# Patient Record
Sex: Female | Born: 1945 | Race: Black or African American | Hispanic: No | Smoking: Former smoker
Health system: Southern US, Community
[De-identification: ages and names within clinical notes are randomized; demographics above are authoritative.]

## PROBLEM LIST (undated history)

## (undated) DIAGNOSIS — M130 Polyarthritis, unspecified: Secondary | ICD-10-CM

## (undated) DIAGNOSIS — J069 Acute upper respiratory infection, unspecified: Secondary | ICD-10-CM

## (undated) DIAGNOSIS — S92504A Nondisplaced unspecified fracture of right lesser toe(s), initial encounter for closed fracture: Secondary | ICD-10-CM

## (undated) DIAGNOSIS — H548 Legal blindness, as defined in USA: Secondary | ICD-10-CM

## (undated) DIAGNOSIS — E7800 Pure hypercholesterolemia, unspecified: Secondary | ICD-10-CM

## (undated) DIAGNOSIS — K7689 Other specified diseases of liver: Secondary | ICD-10-CM

## (undated) DIAGNOSIS — Z860100 Personal history of colon polyps, unspecified: Secondary | ICD-10-CM

## (undated) DIAGNOSIS — K219 Gastro-esophageal reflux disease without esophagitis: Secondary | ICD-10-CM

## (undated) DIAGNOSIS — E876 Hypokalemia: Secondary | ICD-10-CM

## (undated) DIAGNOSIS — M1990 Unspecified osteoarthritis, unspecified site: Secondary | ICD-10-CM

## (undated) DIAGNOSIS — R1310 Dysphagia, unspecified: Secondary | ICD-10-CM

## (undated) DIAGNOSIS — M65322 Trigger finger, left index finger: Secondary | ICD-10-CM

## (undated) DIAGNOSIS — M79676 Pain in unspecified toe(s): Secondary | ICD-10-CM

## (undated) DIAGNOSIS — H3552 Pigmentary retinal dystrophy: Secondary | ICD-10-CM

## (undated) DIAGNOSIS — R200 Anesthesia of skin: Secondary | ICD-10-CM

## (undated) DIAGNOSIS — M810 Age-related osteoporosis without current pathological fracture: Secondary | ICD-10-CM

## (undated) DIAGNOSIS — L739 Follicular disorder, unspecified: Secondary | ICD-10-CM

## (undated) DIAGNOSIS — M7989 Other specified soft tissue disorders: Secondary | ICD-10-CM

## (undated) DIAGNOSIS — M109 Gout, unspecified: Secondary | ICD-10-CM

## (undated) DIAGNOSIS — E559 Vitamin D deficiency, unspecified: Secondary | ICD-10-CM

## (undated) DIAGNOSIS — L309 Dermatitis, unspecified: Secondary | ICD-10-CM

## (undated) DIAGNOSIS — M8580 Other specified disorders of bone density and structure, unspecified site: Secondary | ICD-10-CM

## (undated) DIAGNOSIS — M545 Low back pain, unspecified: Secondary | ICD-10-CM

## (undated) DIAGNOSIS — Z9889 Other specified postprocedural states: Secondary | ICD-10-CM

## (undated) DIAGNOSIS — R768 Other specified abnormal immunological findings in serum: Secondary | ICD-10-CM

## (undated) DIAGNOSIS — G43909 Migraine, unspecified, not intractable, without status migrainosus: Secondary | ICD-10-CM

## (undated) DIAGNOSIS — E042 Nontoxic multinodular goiter: Secondary | ICD-10-CM

## (undated) DIAGNOSIS — I10 Essential (primary) hypertension: Secondary | ICD-10-CM

## (undated) DIAGNOSIS — E041 Nontoxic single thyroid nodule: Secondary | ICD-10-CM

## (undated) DIAGNOSIS — B977 Papillomavirus as the cause of diseases classified elsewhere: Secondary | ICD-10-CM

## (undated) DIAGNOSIS — S9031XA Contusion of right foot, initial encounter: Secondary | ICD-10-CM

## (undated) HISTORY — PX: CATARACT EXTRACTION: SUR2

## (undated) HISTORY — DX: Pigmentary retinal dystrophy: H35.52

## (undated) HISTORY — DX: Dysphagia, unspecified: R13.10

## (undated) HISTORY — DX: Gout, unspecified: M10.9

## (undated) HISTORY — DX: Legal blindness, as defined in USA: H54.8

## (undated) HISTORY — DX: Other specified disorders of bone density and structure, unspecified site: M85.80

## (undated) HISTORY — DX: Pure hypercholesterolemia, unspecified: E78.00

## (undated) HISTORY — DX: Acute upper respiratory infection, unspecified: J06.9

## (undated) HISTORY — DX: Papillomavirus as the cause of diseases classified elsewhere: B97.7

## (undated) HISTORY — DX: Hypokalemia: E87.6

## (undated) HISTORY — DX: Trigger finger, left index finger: M65.322

## (undated) HISTORY — DX: Other specified soft tissue disorders: M79.89

## (undated) HISTORY — DX: Nontoxic multinodular goiter: E04.2

## (undated) HISTORY — DX: Essential (primary) hypertension: I10

## (undated) HISTORY — DX: Other specified abnormal immunological findings in serum: R76.8

## (undated) HISTORY — DX: Pain in unspecified toe(s): M79.676

## (undated) HISTORY — DX: Gastro-esophageal reflux disease without esophagitis: K21.9

## (undated) HISTORY — DX: Migraine, unspecified, not intractable, without status migrainosus: G43.909

## (undated) HISTORY — DX: Contusion of right foot, initial encounter: S90.31XA

## (undated) HISTORY — DX: Unspecified osteoarthritis, unspecified site: M19.90

## (undated) HISTORY — DX: Other specified diseases of liver: K76.89

## (undated) HISTORY — DX: Dermatitis, unspecified: L30.9

## (undated) HISTORY — DX: Other specified postprocedural states: Z98.890

## (undated) HISTORY — DX: Polyarthritis, unspecified: M13.0

## (undated) HISTORY — PX: ABDOMINAL HYSTERECTOMY: SHX81

## (undated) HISTORY — DX: Personal history of colon polyps, unspecified: Z86.0100

## (undated) HISTORY — DX: Age-related osteoporosis without current pathological fracture: M81.0

## (undated) HISTORY — DX: Anesthesia of skin: R20.0

## (undated) HISTORY — DX: Low back pain, unspecified: M54.50

## (undated) HISTORY — DX: Vitamin D deficiency, unspecified: E55.9

## (undated) HISTORY — DX: Nontoxic single thyroid nodule: E04.1

## (undated) HISTORY — DX: Personal history of colonic polyps: Z86.010

## (undated) HISTORY — PX: LASER LAPAROSCOPY: SHX1952

## (undated) HISTORY — DX: Nondisplaced unspecified fracture of right lesser toe(s), initial encounter for closed fracture: S92.504A

## (undated) HISTORY — DX: Follicular disorder, unspecified: L73.9

---

## 2008-11-25 ENCOUNTER — Encounter: Admission: RE | Admit: 2008-11-25 | Discharge: 2008-11-25 | Payer: Self-pay | Admitting: Family Medicine

## 2010-11-16 ENCOUNTER — Other Ambulatory Visit: Payer: Self-pay | Admitting: Family Medicine

## 2010-11-16 DIAGNOSIS — Z780 Asymptomatic menopausal state: Secondary | ICD-10-CM

## 2010-11-16 DIAGNOSIS — Z1231 Encounter for screening mammogram for malignant neoplasm of breast: Secondary | ICD-10-CM

## 2010-11-25 ENCOUNTER — Other Ambulatory Visit: Payer: Self-pay

## 2010-11-25 ENCOUNTER — Ambulatory Visit: Payer: Self-pay

## 2010-12-21 ENCOUNTER — Ambulatory Visit
Admission: RE | Admit: 2010-12-21 | Discharge: 2010-12-21 | Disposition: A | Discharge status: Home / Self Care | Payer: Medicare Other | Source: Ambulatory Visit | Attending: Family Medicine | Admitting: Family Medicine

## 2010-12-21 ENCOUNTER — Ambulatory Visit
Admission: RE | Admit: 2010-12-21 | Discharge: 2010-12-21 | Disposition: A | Discharge status: Home / Self Care | Payer: Self-pay | Source: Ambulatory Visit | Attending: Family Medicine | Admitting: Family Medicine

## 2010-12-21 DIAGNOSIS — Z780 Asymptomatic menopausal state: Secondary | ICD-10-CM

## 2010-12-21 DIAGNOSIS — Z1231 Encounter for screening mammogram for malignant neoplasm of breast: Secondary | ICD-10-CM

## 2012-03-07 ENCOUNTER — Other Ambulatory Visit (HOSPITAL_COMMUNITY): Payer: Self-pay | Admitting: Family Medicine

## 2012-03-07 DIAGNOSIS — I6529 Occlusion and stenosis of unspecified carotid artery: Secondary | ICD-10-CM

## 2012-04-02 ENCOUNTER — Encounter (HOSPITAL_COMMUNITY): Payer: Medicare Other

## 2012-04-04 ENCOUNTER — Ambulatory Visit (HOSPITAL_COMMUNITY)
Admission: RE | Admit: 2012-04-04 | Discharge: 2012-04-04 | Disposition: A | Discharge status: Home / Self Care | Payer: Medicare Other | Source: Ambulatory Visit | Attending: Cardiovascular Disease | Admitting: Cardiovascular Disease

## 2012-04-04 DIAGNOSIS — I6529 Occlusion and stenosis of unspecified carotid artery: Secondary | ICD-10-CM | POA: Insufficient documentation

## 2012-04-04 NOTE — Progress Notes (Signed)
Carotid artery duplex completed.  04/04/2012  Joyce Leckey, RDMS, RDCS   

## 2012-05-06 ENCOUNTER — Other Ambulatory Visit: Payer: Self-pay | Admitting: Family Medicine

## 2012-05-06 DIAGNOSIS — Z1231 Encounter for screening mammogram for malignant neoplasm of breast: Secondary | ICD-10-CM

## 2012-05-29 ENCOUNTER — Ambulatory Visit
Admission: RE | Admit: 2012-05-29 | Discharge: 2012-05-29 | Disposition: A | Discharge status: Home / Self Care | Payer: Medicare Other | Source: Ambulatory Visit | Attending: Family Medicine | Admitting: Family Medicine

## 2012-05-29 DIAGNOSIS — Z1231 Encounter for screening mammogram for malignant neoplasm of breast: Secondary | ICD-10-CM

## 2012-07-29 ENCOUNTER — Other Ambulatory Visit: Payer: Self-pay | Admitting: Gastroenterology

## 2012-07-29 DIAGNOSIS — R1310 Dysphagia, unspecified: Secondary | ICD-10-CM

## 2012-07-29 HISTORY — DX: Dysphagia, unspecified: R13.10

## 2012-08-05 ENCOUNTER — Ambulatory Visit
Admission: RE | Admit: 2012-08-05 | Discharge: 2012-08-05 | Disposition: A | Discharge status: Home / Self Care | Payer: Medicare Other | Source: Ambulatory Visit | Attending: Gastroenterology | Admitting: Gastroenterology

## 2012-08-05 DIAGNOSIS — R1310 Dysphagia, unspecified: Secondary | ICD-10-CM

## 2013-05-21 ENCOUNTER — Encounter (INDEPENDENT_AMBULATORY_CARE_PROVIDER_SITE_OTHER): Payer: Self-pay | Admitting: General Surgery

## 2013-05-29 ENCOUNTER — Encounter (INDEPENDENT_AMBULATORY_CARE_PROVIDER_SITE_OTHER): Payer: Self-pay | Admitting: General Surgery

## 2013-05-29 ENCOUNTER — Ambulatory Visit (INDEPENDENT_AMBULATORY_CARE_PROVIDER_SITE_OTHER): Payer: Medicare Other | Admitting: General Surgery

## 2013-05-29 VITALS — BP 127/83 | HR 83 | Temp 98.7°F | Resp 18 | Ht 63.0 in | Wt 178.2 lb

## 2013-05-29 DIAGNOSIS — D1739 Benign lipomatous neoplasm of skin and subcutaneous tissue of other sites: Secondary | ICD-10-CM

## 2013-05-29 DIAGNOSIS — D1720 Benign lipomatous neoplasm of skin and subcutaneous tissue of unspecified limb: Secondary | ICD-10-CM | POA: Insufficient documentation

## 2013-05-29 NOTE — Patient Instructions (Signed)
Once we get clearance from your heart doctor (Dr Johnsie Cancel), our office will contact you to schedule surgery  This is most consistent with a lipoma

## 2013-05-29 NOTE — Progress Notes (Signed)
Patient ID: Tracy Bruce, female   DOB: [birth year], 68 y.o.   MRN: [id number]  Chief Complaint  Patient presents with  . Mass    back    HPI Tracy Bruce is a 68 y.o. female.   HPI 68 yo AAF referred by Dr Orland Penman for evaluation of left posterior shoulder soft tissue mass.  Patient states that the lesion has been present for at least 8 years. However it only recently started bothering her a few months ago. She states it is uncomfortable for her bra strap to lay across the lesion. It has never drained anything. She denies any trauma to the area. She denies any weight loss. She denies any other soft tissue masses. She denies any lymphadenopathy. She denies any fevers or chills.  Past Medical History  Diagnosis Date  . Migraine   . Legally blind   . GERD (gastroesophageal reflux disease)   . H/O colonoscopy   . Retinitis pigmentosa   . Arthritis   . Hypertension     Past Surgical History  Procedure Laterality Date  . Abdominal hysterectomy    . Cataract extraction    . Laser laparoscopy      History reviewed. No pertinent family history.  Social History History  Substance Use Topics  . Smoking status: Former Research scientist (life sciences)  . Smokeless tobacco: Not on file  . Alcohol Use: Yes     Comment: 8 oz    No Known Allergies  Current Outpatient Prescriptions  Medication Sig Dispense Refill  . Ascorbic Acid (VITAMIN C) 100 MG tablet Take 100 mg by mouth daily.      . calcium-vitamin D 250-100 MG-UNIT per tablet Take 1 tablet by mouth 2 (two) times daily.      Marland Kitchen ibuprofen (ADVIL,MOTRIN) 200 MG tablet Take 200 mg by mouth every 6 (six) hours as needed.      Marland Kitchen lisinopril-hydrochlorothiazide (PRINZIDE,ZESTORETIC) 20-25 MG per tablet Take 1 tablet by mouth daily.      . Omega-3 Fatty Acids (FISH OIL) 1000 MG CAPS Take by mouth.      Marland Kitchen omeprazole (PRILOSEC) 20 MG capsule Take 20 mg by mouth daily.      . SUMAtriptan Succinate (IMITREX PO) Take by mouth.      . vitamin A 10000 UNIT capsule  Take 10,000 Units by mouth daily.      . vitamin B-12 (CYANOCOBALAMIN) 1000 MCG tablet Take 1,000 mcg by mouth daily.       No current facility-administered medications for this visit.    Review of Systems Review of Systems  Constitutional: Negative for fever, activity change, appetite change and unexpected weight change.  HENT: Negative for nosebleeds and trouble swallowing.   Eyes: Negative for photophobia and visual disturbance.  Respiratory: Negative for chest tightness and shortness of breath.   Cardiovascular: Negative for chest pain (some chest pounding) and leg swelling.       Denies CP, SOB, orthopnea, PND, DOE; has upcoming appt with cardiology - Dr Johnsie Cancel  Genitourinary: Negative for dysuria and difficulty urinating.  Musculoskeletal: Negative for arthralgias.  Skin: Negative for pallor and rash.  Neurological: Negative for dizziness, seizures, facial asymmetry and numbness.       Denies TIA and amaurosis fugax   Hematological: Negative for adenopathy. Does not bruise/bleed easily.  Psychiatric/Behavioral: Negative for behavioral problems and agitation.    Blood pressure 127/83, pulse 83, temperature 98.7 F (37.1 C), temperature source Temporal, resp. rate 18, height 5\' 3"  (1.6 m), weight 178 lb 3.2 oz (  80.831 kg).  Physical Exam Physical Exam  Vitals reviewed. Constitutional: She is oriented to person, place, and time. She appears well-developed and well-nourished. No distress.  HENT:  Head: Normocephalic and atraumatic.  Right Ear: External ear normal.  Left Ear: External ear normal.  Eyes: Conjunctivae are normal. No scleral icterus.  Neck: Normal range of motion. Neck supple. No tracheal deviation present. No thyromegaly present.  Cardiovascular: Normal rate and normal heart sounds.   Pulmonary/Chest: Effort normal and breath sounds normal. No stridor. No respiratory distress. She has no wheezes.    Left shoulder soft tissue mass 2 x2 cm; well cirumscribed.  Mobile. No LAD. Very mild TTP. No skin changes  Abdominal: Soft. She exhibits no distension.  Musculoskeletal: She exhibits no edema and no tenderness.  Lymphadenopathy:    She has no cervical adenopathy.       Left axillary: No pectoral and no lateral adenopathy present.      Right: No supraclavicular adenopathy present.       Left: No supraclavicular adenopathy present.  Neurological: She is alert and oriented to person, place, and time. She exhibits normal muscle tone.  Skin: Skin is warm and dry. No rash noted. She is not diaphoretic. No erythema.  Psychiatric: She has a normal mood and affect. Her behavior is normal. Judgment and thought content normal.    Data Reviewed Dr [name] note  Assessment    Left shoulder subcutaneous mass     Plan    This is most consistent with a lipoma.  We discussed the etiology and management of lipomas. The patient was given educational material. We discussed that the majority of lipomas are benign although on a rare occasion it can be malignant.   We discussed observation versus surgical excision. We discussed the risks and benefits of surgery including but not limited to bleeding, infection, injury to surrounding structures, scarring, cosmetic concerns, blood clot formation, anesthesia issues, possible recurrence, and the typical postoperative course.   She has an appointment later this month with a cardiologist to evaluate chest pounding so we will need to wait until after that appointment to make sure she is clear from a cardiac standpoint before we allow her to schedule surgery. Our office will contact her to schedule surgery once she has been cleared by the cardiologist  Leighton Ruff. Redmond Pulling, MD, FACS General, Bariatric, & Minimally Invasive Surgery [facility], [place]          [facility] M 05/29/2013, 2:22 PM

## 2013-06-12 ENCOUNTER — Encounter: Payer: Self-pay | Admitting: *Deleted

## 2013-06-12 DIAGNOSIS — D1720 Benign lipomatous neoplasm of skin and subcutaneous tissue of unspecified limb: Secondary | ICD-10-CM

## 2013-06-13 ENCOUNTER — Encounter: Payer: Self-pay | Admitting: Cardiovascular Disease

## 2013-06-13 ENCOUNTER — Ambulatory Visit (INDEPENDENT_AMBULATORY_CARE_PROVIDER_SITE_OTHER): Payer: Medicare Other | Admitting: Cardiovascular Disease

## 2013-06-13 VITALS — BP 130/80 | HR 74 | Ht 63.0 in | Wt 177.8 lb

## 2013-06-13 DIAGNOSIS — R0989 Other specified symptoms and signs involving the circulatory and respiratory systems: Secondary | ICD-10-CM

## 2013-06-13 DIAGNOSIS — E785 Hyperlipidemia, unspecified: Secondary | ICD-10-CM

## 2013-06-13 DIAGNOSIS — R002 Palpitations: Secondary | ICD-10-CM

## 2013-06-13 DIAGNOSIS — R0600 Dyspnea, unspecified: Secondary | ICD-10-CM

## 2013-06-13 DIAGNOSIS — R0609 Other forms of dyspnea: Secondary | ICD-10-CM

## 2013-06-13 NOTE — Patient Instructions (Signed)
Your physician recommends that you schedule a follow-up appointment in: AS NEEDED Your physician recommends that you continue on your current medications as directed. Please refer to the Current Medication list given to you today. Your physician has recommended that you wear an event monitor. Event monitors are medical devices that record the heart's electrical activity. Doctors most often us these monitors to diagnose arrhythmias. Arrhythmias are problems with the speed or rhythm of the heartbeat. The monitor is a small, portable device. You can wear one while you do your normal daily activities. This is usually used to diagnose what is causing palpitations/syncope (passing out).  Your physician has requested that you have an echocardiogram. Echocardiography is a painless test that uses sound waves to create images of your heart. It provides your doctor with information about the size and shape of your heart and how well your heart's chambers and valves are working. This procedure takes approximately one hour. There are no restrictions for this procedure.   

## 2013-06-13 NOTE — Assessment & Plan Note (Signed)
No known vascular disease Diet Rx appropriate F/U primary

## 2013-06-13 NOTE — Progress Notes (Signed)
Patient ID: Tracy Bruce, female   DOB: [birth year], 68 y.o.   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  Been going on for a month.  Pounding in chest.  While I was in room she indicated having it and she appeared to mostly be in regular rhythm with occasional premature beat.  No history of DCM, valve disease syncope or CAD.  Reviewed [facility] records and lab work normal LDL 134 She gets palpitations daily.  If she sits and takes a few deep breaths feels better Denies any unusual stress.  No new stimulants , cold medicines or excess caffeine.  Her cousin was with her today She helps her get around and with transportation     ROS: Denies fever, malais, weight loss, blurry vision, decreased visual acuity, cough, sputum, SOB, hemoptysis, pleuritic pain, palpitaitons, heartburn, abdominal pain, melena, lower extremity edema, claudication, or rash.  All other systems reviewed and negative   General: Affect appropriate Healthy:  appears stated age 68: Blind from RP  Neck supple with no adenopathy JVP normal no bruits no thyromegaly Lungs clear with no wheezing and good diaphragmatic motion Heart:  S1/S2 no murmur,rub, gallop or click PMI normal Abdomen: benighn, BS positve, no tenderness, no AAA no bruit.  No HSM or HJR Distal pulses intact with no bruits No edema Neuro non-focal Skin warm and dry No muscular weakness  Medications Current Outpatient Prescriptions  Medication Sig Dispense Refill  . ibuprofen (ADVIL,MOTRIN) 200 MG tablet Take 200 mg by mouth every 6 (six) hours as needed.      Marland Kitchen lisinopril-hydrochlorothiazide (PRINZIDE,ZESTORETIC) 20-25 MG per tablet Take 1 tablet by mouth daily.      . NON FORMULARY OMEGA RED DAILY      . Omega-3 Fatty Acids (FISH OIL) 1000 MG CAPS Take by mouth.      . SUMAtriptan Succinate (IMITREX PO) Take by mouth.      . vitamin A 10000 UNIT capsule Take 10,000 Units by mouth daily.      . vitamin B-12  (CYANOCOBALAMIN) 1000 MCG tablet Take 1,000 mcg by mouth daily.       No current facility-administered medications for this visit.    Allergies Review of patient's allergies indicates no known allergies.  Family History: Family History  Problem Relation Age of Onset  . Lung cancer Father   . Heart disease Mother   . Lung cancer Brother   . Liver disease Brother   . Colon cancer Maternal Aunt   . Colon cancer Maternal Uncle   . Colon polyps Cousin   . Alcohol abuse Brother   . Alcohol abuse Brother   . Alcohol abuse Brother   . Alcohol abuse Brother   . Schizophrenia Brother   . Drug abuse Brother   . Alcohol abuse Brother     Social History: History   Social History  . Marital Status: Divorced    Spouse Name: N/A    Number of Children: 3  . Years of Education: N/A   Occupational History  . Not on file.   Social History Main Topics  . Smoking status: Former Research scientist (life sciences)  . Smokeless tobacco: Not on file     Comment: quit early 2000's  . Alcohol Use: 0.0 oz/week     Comment: glass of wine monthly   . Drug Use: No  . Sexual Activity: Not on file   Other Topics Concern  . Not on file   Social History  Narrative  . No narrative on file    Electrocardiogram:  SR rate 61 normal ECG   Assessment and Plan

## 2013-06-13 NOTE — Assessment & Plan Note (Signed)
Benign sounding Normal ECG and exam  Since she describes them as daily will get event monitor.  Echo to r/o structural heart disease

## 2013-06-25 ENCOUNTER — Ambulatory Visit (HOSPITAL_COMMUNITY): Payer: Medicare Other | Attending: Cardiovascular Disease | Admitting: Radiology

## 2013-06-25 ENCOUNTER — Encounter: Payer: Self-pay | Admitting: Cardiovascular Disease

## 2013-06-25 DIAGNOSIS — R0609 Other forms of dyspnea: Secondary | ICD-10-CM | POA: Insufficient documentation

## 2013-06-25 DIAGNOSIS — R072 Precordial pain: Secondary | ICD-10-CM

## 2013-06-25 DIAGNOSIS — R002 Palpitations: Secondary | ICD-10-CM | POA: Insufficient documentation

## 2013-06-25 DIAGNOSIS — R0600 Dyspnea, unspecified: Secondary | ICD-10-CM

## 2013-06-25 DIAGNOSIS — R0989 Other specified symptoms and signs involving the circulatory and respiratory systems: Secondary | ICD-10-CM | POA: Insufficient documentation

## 2013-06-25 NOTE — Progress Notes (Signed)
Echocardiogram Performed. 

## 2013-06-27 ENCOUNTER — Other Ambulatory Visit (HOSPITAL_COMMUNITY)
Admission: RE | Admit: 2013-06-27 | Discharge: 2013-06-27 | Disposition: A | Discharge status: Home / Self Care | Payer: Medicare Other | Source: Ambulatory Visit | Attending: Family Medicine | Admitting: Family Medicine

## 2013-06-27 ENCOUNTER — Other Ambulatory Visit: Payer: Self-pay | Admitting: Family Medicine

## 2013-06-27 DIAGNOSIS — Z1151 Encounter for screening for human papillomavirus (HPV): Secondary | ICD-10-CM | POA: Insufficient documentation

## 2013-06-27 DIAGNOSIS — Z124 Encounter for screening for malignant neoplasm of cervix: Secondary | ICD-10-CM | POA: Insufficient documentation

## 2013-06-27 DIAGNOSIS — R87810 Cervical high risk human papillomavirus (HPV) DNA test positive: Secondary | ICD-10-CM | POA: Insufficient documentation

## 2013-07-15 ENCOUNTER — Encounter: Payer: Self-pay | Admitting: *Deleted

## 2013-08-21 ENCOUNTER — Telehealth (INDEPENDENT_AMBULATORY_CARE_PROVIDER_SITE_OTHER): Payer: Self-pay | Admitting: General Surgery

## 2013-08-21 NOTE — Telephone Encounter (Signed)
Message copied by Maryclare Bean on Thu Aug 21, 2013 12:07 PM ------      Message from: Joya San      Created: Thu Aug 21, 2013 10:20 AM      Contact: [phone]       Pt was cleared from cardiologist what is the next step? ------

## 2013-08-21 NOTE — Telephone Encounter (Signed)
Patient just called me back and I told her that I don't see where she is cleared for surgery from Dr Johnsie Cancel. I sent another request to Dr Johnsie Cancel asking if she is cleared for surgery, and I told the patient that once I get the clearance I will call her

## 2013-08-22 ENCOUNTER — Encounter (INDEPENDENT_AMBULATORY_CARE_PROVIDER_SITE_OTHER): Payer: Self-pay

## 2013-08-27 ENCOUNTER — Encounter (INDEPENDENT_AMBULATORY_CARE_PROVIDER_SITE_OTHER): Payer: Self-pay

## 2013-08-27 NOTE — Telephone Encounter (Signed)
LMOM to let patient know that we have cardiac clearance from Dr Jenkins Rouge and her orders are up at the schedulers and they will call her this week or next week to get her schedule

## 2014-06-29 ENCOUNTER — Other Ambulatory Visit: Payer: Self-pay | Admitting: Family Medicine

## 2014-06-29 ENCOUNTER — Other Ambulatory Visit (HOSPITAL_COMMUNITY)
Admission: RE | Admit: 2014-06-29 | Discharge: 2014-06-29 | Disposition: A | Discharge status: Home / Self Care | Payer: Medicare Other | Source: Ambulatory Visit | Attending: Family Medicine | Admitting: Family Medicine

## 2014-06-29 DIAGNOSIS — Z124 Encounter for screening for malignant neoplasm of cervix: Secondary | ICD-10-CM | POA: Diagnosis present

## 2014-06-29 DIAGNOSIS — Z1151 Encounter for screening for human papillomavirus (HPV): Secondary | ICD-10-CM | POA: Insufficient documentation

## 2014-06-29 DIAGNOSIS — R87810 Cervical high risk human papillomavirus (HPV) DNA test positive: Secondary | ICD-10-CM | POA: Insufficient documentation

## 2014-06-30 LAB — CYTOLOGY - PAP

## 2014-07-27 ENCOUNTER — Other Ambulatory Visit: Payer: Self-pay | Admitting: Family Medicine

## 2015-07-06 ENCOUNTER — Other Ambulatory Visit (HOSPITAL_COMMUNITY)
Admission: RE | Admit: 2015-07-06 | Discharge: 2015-07-06 | Disposition: A | Discharge status: Home / Self Care | Payer: Medicare Other | Source: Ambulatory Visit | Attending: Family Medicine | Admitting: Family Medicine

## 2015-07-06 ENCOUNTER — Other Ambulatory Visit: Payer: Self-pay

## 2015-07-06 DIAGNOSIS — Z01411 Encounter for gynecological examination (general) (routine) with abnormal findings: Secondary | ICD-10-CM | POA: Insufficient documentation

## 2015-07-06 DIAGNOSIS — Z1151 Encounter for screening for human papillomavirus (HPV): Secondary | ICD-10-CM | POA: Diagnosis present

## 2015-07-08 LAB — CYTOLOGY - PAP

## 2015-11-19 ENCOUNTER — Encounter (HOSPITAL_COMMUNITY): Payer: Self-pay | Admitting: *Deleted

## 2015-11-19 ENCOUNTER — Inpatient Hospital Stay (HOSPITAL_COMMUNITY)
Admission: EM | Admit: 2015-11-19 | Discharge: 2015-11-21 | DRG: 313 | Disposition: A | Discharge status: Home / Self Care | Payer: Medicare Other | Attending: Internal Medicine | Admitting: Internal Medicine

## 2015-11-19 ENCOUNTER — Emergency Department (HOSPITAL_COMMUNITY): Payer: Medicare Other

## 2015-11-19 ENCOUNTER — Inpatient Hospital Stay (HOSPITAL_COMMUNITY): Payer: Medicare Other

## 2015-11-19 DIAGNOSIS — M7989 Other specified soft tissue disorders: Secondary | ICD-10-CM | POA: Diagnosis not present

## 2015-11-19 DIAGNOSIS — R1310 Dysphagia, unspecified: Secondary | ICD-10-CM | POA: Diagnosis present

## 2015-11-19 DIAGNOSIS — I10 Essential (primary) hypertension: Secondary | ICD-10-CM

## 2015-11-19 DIAGNOSIS — K219 Gastro-esophageal reflux disease without esophagitis: Secondary | ICD-10-CM | POA: Diagnosis present

## 2015-11-19 DIAGNOSIS — H3552 Pigmentary retinal dystrophy: Secondary | ICD-10-CM | POA: Diagnosis present

## 2015-11-19 DIAGNOSIS — Z79899 Other long term (current) drug therapy: Secondary | ICD-10-CM

## 2015-11-19 DIAGNOSIS — D72829 Elevated white blood cell count, unspecified: Secondary | ICD-10-CM

## 2015-11-19 DIAGNOSIS — H548 Legal blindness, as defined in USA: Secondary | ICD-10-CM | POA: Diagnosis present

## 2015-11-19 DIAGNOSIS — E785 Hyperlipidemia, unspecified: Secondary | ICD-10-CM

## 2015-11-19 DIAGNOSIS — R600 Localized edema: Secondary | ICD-10-CM | POA: Diagnosis present

## 2015-11-19 DIAGNOSIS — Z801 Family history of malignant neoplasm of trachea, bronchus and lung: Secondary | ICD-10-CM | POA: Diagnosis not present

## 2015-11-19 DIAGNOSIS — Z8249 Family history of ischemic heart disease and other diseases of the circulatory system: Secondary | ICD-10-CM

## 2015-11-19 DIAGNOSIS — R0789 Other chest pain: Principal | ICD-10-CM | POA: Diagnosis present

## 2015-11-19 DIAGNOSIS — Z87891 Personal history of nicotine dependence: Secondary | ICD-10-CM | POA: Diagnosis not present

## 2015-11-19 DIAGNOSIS — I200 Unstable angina: Secondary | ICD-10-CM | POA: Diagnosis not present

## 2015-11-19 DIAGNOSIS — R072 Precordial pain: Secondary | ICD-10-CM | POA: Diagnosis not present

## 2015-11-19 DIAGNOSIS — E042 Nontoxic multinodular goiter: Secondary | ICD-10-CM | POA: Diagnosis present

## 2015-11-19 DIAGNOSIS — M79609 Pain in unspecified limb: Secondary | ICD-10-CM | POA: Diagnosis not present

## 2015-11-19 DIAGNOSIS — R079 Chest pain, unspecified: Secondary | ICD-10-CM

## 2015-11-19 DIAGNOSIS — D1722 Benign lipomatous neoplasm of skin and subcutaneous tissue of left arm: Secondary | ICD-10-CM | POA: Diagnosis present

## 2015-11-19 DIAGNOSIS — R221 Localized swelling, mass and lump, neck: Secondary | ICD-10-CM

## 2015-11-19 LAB — BASIC METABOLIC PANEL
Anion gap: 9 (ref 5–15)
BUN: 8 mg/dL (ref 6–20)
CALCIUM: 10 mg/dL (ref 8.9–10.3)
CO2: 25 mmol/L (ref 22–32)
CREATININE: 0.96 mg/dL (ref 0.44–1.00)
Chloride: 104 mmol/L (ref 101–111)
GFR, EST NON AFRICAN AMERICAN: 59 mL/min — AB (ref >60)
Glucose, Bld: 104 mg/dL — ABNORMAL HIGH (ref 65–99)
Potassium: 3.5 mmol/L (ref 3.5–5.1)
Sodium: 138 mmol/L (ref 135–145)

## 2015-11-19 LAB — CBC
HCT: 40.5 % (ref 36.0–46.0)
HEMOGLOBIN: 13.5 g/dL (ref 12.0–15.0)
MCH: 30.1 pg (ref 26.0–34.0)
MCHC: 33.3 g/dL (ref 30.0–36.0)
MCV: 90.2 fL (ref 78.0–100.0)
PLATELETS: 340 10*3/uL (ref 150–400)
RBC: 4.49 MIL/uL (ref 3.87–5.11)
RDW: 13.9 % (ref 11.5–15.5)
WBC: 12.9 10*3/uL — ABNORMAL HIGH (ref 4.0–10.5)

## 2015-11-19 LAB — SEDIMENTATION RATE: Sed Rate: 29 mm/hr — ABNORMAL HIGH (ref 0–22)

## 2015-11-19 LAB — TROPONIN I

## 2015-11-19 MED ORDER — IOPAMIDOL (ISOVUE-370) INJECTION 76%
INTRAVENOUS | Status: AC
  Administered 2015-11-19: 22:00:00
  Filled 2015-11-19: qty 100

## 2015-11-19 MED ORDER — IBUPROFEN 400 MG PO TABS: 400.0000 mg | ORAL_TABLET | Freq: Three times a day (TID) | ORAL | Status: DC

## 2015-11-19 MED ORDER — MORPHINE SULFATE (PF) 2 MG/ML IV SOLN
1.0000 mg | INTRAVENOUS | Status: DC | PRN
  Administered 2015-11-20: 1 mg via INTRAVENOUS
  Filled 2015-11-19: qty 1

## 2015-11-19 MED ORDER — ONDANSETRON HCL 4 MG/2ML IJ SOLN: 4.0000 mg | Freq: Four times a day (QID) | INTRAMUSCULAR | Status: DC | PRN

## 2015-11-19 MED ORDER — SODIUM CHLORIDE 0.9% FLUSH
3.0000 mL | Freq: Two times a day (BID) | INTRAVENOUS | Status: DC
  Administered 2015-11-19 – 2015-11-21 (×4): 3 mL via INTRAVENOUS

## 2015-11-19 MED ORDER — ACETAMINOPHEN 325 MG PO TABS: 650.0000 mg | ORAL_TABLET | Freq: Four times a day (QID) | ORAL | Status: DC | PRN

## 2015-11-19 MED ORDER — ACETAMINOPHEN 650 MG RE SUPP: 650.0000 mg | Freq: Four times a day (QID) | RECTAL | Status: DC | PRN

## 2015-11-19 MED ORDER — ENOXAPARIN SODIUM 40 MG/0.4ML ~~LOC~~ SOLN
40.0000 mg | SUBCUTANEOUS | Status: DC
  Administered 2015-11-20: 40 mg via SUBCUTANEOUS
  Filled 2015-11-19 (×3): qty 0.4

## 2015-11-19 MED ORDER — ONDANSETRON HCL 4 MG PO TABS: 4.0000 mg | ORAL_TABLET | Freq: Four times a day (QID) | ORAL | Status: DC | PRN

## 2015-11-19 MED ORDER — IOPAMIDOL (ISOVUE-300) INJECTION 61%
INTRAVENOUS | Status: AC
  Filled 2015-11-19: qty 100

## 2015-11-19 MED ORDER — NITROGLYCERIN IN D5W 200-5 MCG/ML-% IV SOLN
0.0000 ug/min | INTRAVENOUS | Status: DC
  Filled 2015-11-19: qty 250

## 2015-11-19 MED ORDER — LISINOPRIL 20 MG PO TABS
20.0000 mg | ORAL_TABLET | Freq: Every day | ORAL | Status: DC
  Administered 2015-11-20 – 2015-11-21 (×2): 20 mg via ORAL
  Filled 2015-11-19 (×2): qty 1

## 2015-11-19 MED ORDER — IBUPROFEN 200 MG PO TABS
600.0000 mg | ORAL_TABLET | Freq: Three times a day (TID) | ORAL | Status: DC
  Administered 2015-11-19 – 2015-11-21 (×5): 600 mg via ORAL
  Filled 2015-11-19 (×3): qty 3
  Filled 2015-11-19: qty 1
  Filled 2015-11-19 (×2): qty 3

## 2015-11-19 NOTE — ED Provider Notes (Signed)
[facility] DEPT Provider Note   CSN: [id number] Arrival date & time: 11/19/15  1629   History   Chief Complaint Chief Complaint  Patient presents with  . Chest Pain    HPI Tracy Bruce is a 70 y.o. female.  The history is provided by the patient and medical records.   70 year old female with history of hypertension, hyperlipidemia, remote tobacco smoking (quit 12 years ago), GERD, visual impairment, migraines presenting with chest pain. Onset was today around 5 AM, awakening from sleep. Initially was located in left shoulder. Now substernal. Radiating to both shoulders and neck. Described as throbbing. Worse with exertion. Alleviated by nitroglycerin and aspirin given by PCP and at triage. Associated with shortness of breath, nausea. No diaphoresis, leg swelling, palpitations, vomiting, fevers. No prior history of MI. States she was seen by cardiologist once for palpitations and had an echo but no stress test. No significant family history of cardiac disease.   Past Medical History:  Diagnosis Date  . Arthritis   . Dysphagia, unspecified(787.20) 07/29/12  . GERD (gastroesophageal reflux disease)   . H/O colonoscopy   . Hypertension   . Legally blind   . Migraine   . Retinitis pigmentosa     Patient Active Problem List   Diagnosis Date Noted  . Palpitations 06/13/2013  . Elevated lipids 06/13/2013  . 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 ABDOMINAL HYSTERECTOMY    . CATARACT EXTRACTION    . LASER LAPAROSCOPY      OB History    No data available       Home Medications    Prior to Admission medications   Medication Sig Start Date End Date Taking? Authorizing Provider  ibuprofen (ADVIL,MOTRIN) 200 MG tablet Take 200 mg by mouth every 6 (six) hours as needed.    Historical Provider, MD  lisinopril-hydrochlorothiazide (PRINZIDE,ZESTORETIC) 20-25 MG per tablet Take 1 tablet by mouth daily.    Historical Provider,  MD  NON FORMULARY OMEGA RED DAILY    Historical Provider, MD  Omega-3 Fatty Acids (FISH OIL) 1000 MG CAPS Take by mouth.    Historical Provider, MD  SUMAtriptan Succinate (IMITREX PO) Take by mouth.    Historical Provider, MD  vitamin A 10000 UNIT capsule Take 10,000 Units by mouth daily.    Historical Provider, MD  vitamin B-12 (CYANOCOBALAMIN) 1000 MCG tablet Take 1,000 mcg by mouth daily.    Historical Provider, MD    Family History Family History  Problem Relation Age of Onset  . Heart disease Mother   . Lung cancer Father   . Lung cancer Brother   . Liver disease Brother   . Alcohol abuse Brother   . Alcohol abuse Brother   . Alcohol abuse Brother   . Alcohol abuse Brother   . Schizophrenia Brother   . Drug abuse Brother   . Colon cancer Maternal Aunt   . Colon cancer Maternal Uncle   . Colon polyps Cousin   . Alcohol abuse Brother     Social History Social History  Substance Use Topics  . Smoking status: Former Research scientist (life sciences)  . Smokeless tobacco: Never Used     Comment: quit early 2000's  . Alcohol use 0.0 oz/week     Comment: glass of wine monthly      Allergies   Review of patient's allergies indicates no known allergies.   Review of Systems Review of Systems  Constitutional: Negative for chills and fever.  Respiratory:  Positive for shortness of breath. Negative for cough.   Cardiovascular: Positive for chest pain. Negative for palpitations and leg swelling.  Gastrointestinal: Positive for nausea. Negative for abdominal pain and vomiting.  Genitourinary: Negative for decreased urine volume.  Skin: Negative for rash.  Hematological: Does not bruise/bleed easily.  All other systems reviewed and are negative.   Physical Exam Updated Vital Signs BP 127/96   Pulse 96   Temp 99.3 F (37.4 C)   Resp 18   Ht 5\' 3"  (1.6 m)   Wt 76.9 kg   SpO2 97%   BMI 30.04 kg/m   Physical Exam  Constitutional: She appears well-developed and well-nourished. No distress.    HENT:  Head: Normocephalic and atraumatic.  Eyes: Conjunctivae are normal.  Neck: Neck supple.  Cardiovascular: Normal rate and regular rhythm.   Pulmonary/Chest: Effort normal and breath sounds normal. No respiratory distress. She exhibits no tenderness.  Abdominal: Soft. There is no tenderness.  Musculoskeletal: She exhibits no deformity.  Neurological: She is alert.  Skin: Skin is warm and dry.  Psychiatric: She has a normal mood and affect.  Nursing note and vitals reviewed.    ED Treatments / Results  Labs (all labs ordered are listed, but only abnormal results are displayed) Labs Reviewed  CBC - Abnormal; Notable for the following:       Result Value   WBC 12.9 (*)    All other components within normal limits  BASIC METABOLIC PANEL  TROPONIN I    EKG  EKG Interpretation  Date/Time:  Friday November 19 2015 16:29:59 EDT Ventricular Rate:  97 PR Interval:  144 QRS Duration: 72 QT Interval:  342 QTC Calculation: 434 R Axis:   64 Text Interpretation:  Normal sinus rhythm Low voltage QRS Nonspecific T wave abnormality Anterior leads Abnormal ECG No previous tracing Confirmed by KNOTT MD, Quillian Quince [phone]) on 11/19/2015 4:58:34 PM       Radiology Dg Chest 2 View  Result Date: 11/19/2015 CLINICAL DATA:  70 year old female with acute chest pain today. EXAM: CHEST  2 VIEW COMPARISON:  None. FINDINGS: Mild cardiomegaly noted. There is no evidence of focal airspace disease, pulmonary edema, suspicious pulmonary nodule/mass, pleural effusion, or pneumothorax. No acute bony abnormalities are identified. IMPRESSION: Mild cardiomegaly without evidence of active cardiopulmonary disease. Electronically Signed   By: Margarette Canada M.D.   On: 11/19/2015 17:02    Procedures Procedures (including critical care time)  Medications Ordered in ED Medications - No data to display   Initial Impression / Assessment and Plan / ED Course  I have reviewed the triage vital signs and the  nursing notes.  Pertinent labs & imaging results that were available during my care of the patient were reviewed by me and considered in my medical decision making (see chart for details).  Clinical Course    70 year old female with history of hypertension, hyperlipidemia, remote tobacco smoking (quit 12 years ago), GERD, visual impairment, migraines presenting with chest pain as above. S/p ASA and NTG with improvement. Trop neg x1. Low risk for PE per [facility]. No PTX or PNA on CXR, hx inconsistent. HEAR score 6. Will admit for ACS rule out / eval for UA. Admitted in stable condition.   Case discussed with Dr. Laneta Simmers who oversaw management of this patient.    Final Clinical Impressions(s) / ED Diagnoses   Final diagnoses:  Chest pain at rest  Nodule of neck    New Prescriptions New Prescriptions   No medications on file  Ivin Booty, MD 11/20/15 2056    Leo Grosser, MD 11/20/15 2329

## 2015-11-19 NOTE — ED Notes (Signed)
Patient transported to CT 

## 2015-11-19 NOTE — ED Notes (Addendum)
Dr. Wynonia Lawman ordered that pt not be sent to floor until CT is completed.  CT notified/. Dr. Wynonia Lawman also verbally ordered that the Nitro drip not be started.

## 2015-11-19 NOTE — H&P (Signed)
History and Physical  Tracy Bruce [id number] DOB: Oct 06, 1945 DOA: 11/19/2015   PCP: Gavin Pound, MD  Referring Physician: ED/ Dr. Amedeo Gory   Patient coming from: Home  Chief Complaint: chest discomfort  HPI:  Tracy Bruce is a 70 y.o. female with medical history of hypertension, dyslipidemia presents with chest discomfort that began around 5 AM when she woke up on the morning of 11/19/2015. She describes it as a constant pressure-like sensation radiating to her shoulders and her jaw. She stated that this was worse when she had to climb 2 flights of stairs at her home. She has associated shortness of breath without any nausea, diaphoresis, or presyncopal symptoms. She went to see her primary care provider on the afternoon of 11/19/2015. The patient was given sublingual nitroglycerin which relieved her chest discomfort although not completely. After a period of 30-60 minutes, the patient stated that her chest discomfort returned. She presented to the emergency department where workup revealed EKG with nonspecific T wave changes and initial troponin was negative. Chest x-ray was negative for acute findings. The patient is afebrile and hemodynamically stable. BMP was unremarkable. CBC did show the CBC of 12.9, hemoglobin 13.5, platelets 340,000. During her stay in the emergency department, the patient continued to have pressure sensation substernally.  Assessment/Plan: Chest discomfort -Concerning for unstable angina -I have consulted cardiology--spoke with Rosaria Ferries -start IV nitro -IV morphine prn recurrent cp -Continue ASA 325 mg -check LDL and HbA1c for continued risk stratification and optimization -cycle troponins -supplemental oxygen -Wells score = 0  HTN -Continue lisinopril -Hold HCTZ for now in the setting of possible cardiac intervention  Leukocytosis -suspect stress demargination -UA and urine culture -CXR neg -hold antibiotics -am CBC  Leg  Edema -venous duplex r/o DVT        Past Medical History:  Diagnosis Date  . Arthritis   . Dysphagia, unspecified(787.20) 07/29/12  . GERD (gastroesophageal reflux disease)   . H/O colonoscopy   . Hypertension   . Legally blind   . Migraine   . Retinitis pigmentosa    Past Surgical History:  Procedure Laterality Date  . ABDOMINAL HYSTERECTOMY    . CATARACT EXTRACTION    . LASER LAPAROSCOPY     Social History:  reports that she has quit smoking. She has never used smokeless tobacco. She reports that she drinks alcohol. She reports that she does not use drugs.   Family History  Problem Relation Age of Onset  . Heart disease Mother   . Lung cancer Father   . Lung cancer Brother   . Liver disease Brother   . Alcohol abuse Brother   . Alcohol abuse Brother   . Alcohol abuse Brother   . Alcohol abuse Brother   . Schizophrenia Brother   . Drug abuse Brother   . Colon cancer Maternal Aunt   . Colon cancer Maternal Uncle   . Colon polyps Cousin   . Alcohol abuse Brother      No Known Allergies   Prior to Admission medications   Medication Sig Start Date End Date Taking? Authorizing Provider  Ascorbic Acid (VITAMIN C PO) Take 1 tablet by mouth daily.   Yes Historical Provider, MD  aspirin 81 MG chewable tablet Chew 81 mg by mouth daily.   Yes Historical Provider, MD  co-enzyme Q-10 30 MG capsule Take 30 mg by mouth daily.   Yes Historical Provider, MD  ibuprofen (ADVIL,MOTRIN) 200 MG tablet Take 200 mg by mouth every  6 (six) hours as needed.   Yes Historical Provider, MD  lisinopril-hydrochlorothiazide (PRINZIDE,ZESTORETIC) 20-25 MG per tablet Take 1 tablet by mouth daily.   Yes Historical Provider, MD  NON [place]   Yes Historical Provider, MD  Omega-3 Fatty Acids (FISH OIL) 1000 MG CAPS Take by mouth.   Yes Historical Provider, MD  vitamin A 10000 UNIT capsule Take 10,000 Units by mouth daily.   Yes Historical Provider, MD  vitamin B-12  (CYANOCOBALAMIN) 1000 MCG tablet Take 1,000 mcg by mouth daily.   Yes Historical Provider, MD    Review of Systems:  Constitutional:  No weight loss, night sweats, Fevers, chills, fatigue.  Head&Eyes: No headache.  No vision loss.  No eye pain or scotoma ENT:  No Difficulty swallowing,Tooth/dental problems,Sore throat,  No ear ache, post nasal drip,  Cardio-vascular:  NoOrthopnea, PND, swelling in lower extremities,  dizziness, palpitations  GI:  No  abdominal pain, nausea, vomiting, diarrhea, loss of appetite, hematochezia, melena, heartburn, indigestion, Resp:  No cough. No coughing up of blood .No wheezing.No chest wall deformity  Skin:  no rash or lesions.  GU:  no dysuria, change in color of urine, no urgency or frequency. No flank pain.  Musculoskeletal:  No joint pain or swelling. No decreased range of motion. No back pain.  Psych:  No change in mood or affect. No depression or anxiety. Neurologic: No headache, no dysesthesia, no focal weakness, no vision loss. No syncope  Physical Exam: Vitals:   11/19/15 1632 11/19/15 1635 11/19/15 1730 11/19/15 1745  BP: 127/96  133/92 133/89  Pulse: 96  84 79  Resp: 18     Temp: 99.3 F (37.4 C)     SpO2: 97%  98% 98%  Weight:  76.9 kg (169 lb 9 oz)    Height:  5\' 3"  (1.6 m)     General:  A&O x 3, NAD, nontoxic, pleasant/cooperative Head/Eye: No conjunctival hemorrhage, no icterus, [place]/AT, No nystagmus ENT:  No icterus,  No thrush, good dentition, no pharyngeal exudate Neck:  No masses, no lymphadenpathy, no bruits CV:  RRR, no rub, no gallop, no S3 Lung:  Diminished breath sounds at the bases but clear to auscultation. Abdomen: soft/NT, +BS, nondistended, no peritoneal signs Ext: No cyanosis, No rashes, No petechiae, No lymphangitis, No edema Neuro: CNII-XII intact, strength 4/5 in bilateral upper and lower extremities, no dysmetria  Labs on Admission:  Basic Metabolic Panel:  Recent Labs Lab 11/19/15 1644  NA 138   K 3.5  CL 104  CO2 25  GLUCOSE 104*  BUN 8  CREATININE 0.96  CALCIUM 10.0   Liver Function Tests: No results for input(s): AST, ALT, ALKPHOS, BILITOT, PROT, ALBUMIN in the last 168 hours. No results for input(s): LIPASE, AMYLASE in the last 168 hours. No results for input(s): AMMONIA in the last 168 hours. CBC:  Recent Labs Lab 11/19/15 1644  WBC 12.9*  HGB 13.5  HCT 40.5  MCV 90.2  PLT 340   Coagulation Profile: No results for input(s): INR, PROTIME in the last 168 hours. Cardiac Enzymes:  Recent Labs Lab 11/19/15 1644  TROPONINI <0.03   BNP: Invalid input(s): POCBNP CBG: No results for input(s): GLUCAP in the last 168 hours. Urine analysis: No results found for: COLORURINE, APPEARANCEUR, LABSPEC, PHURINE, GLUCOSEU, HGBUR, BILIRUBINUR, KETONESUR, PROTEINUR, UROBILINOGEN, NITRITE, LEUKOCYTESUR Sepsis Labs: @LABRCNTIP (procalcitonin:4,lacticidven:4) )No results found for this or any previous visit (from the past 240 hour(s)).   Radiological Exams on Admission: Dg Chest 2 View  Result Date: 11/19/2015 CLINICAL DATA:  70 year old female with acute chest pain today. EXAM: CHEST  2 VIEW COMPARISON:  None. FINDINGS: Mild cardiomegaly noted. There is no evidence of focal airspace disease, pulmonary edema, suspicious pulmonary nodule/mass, pleural effusion, or pneumothorax. No acute bony abnormalities are identified. IMPRESSION: Mild cardiomegaly without evidence of active cardiopulmonary disease. Electronically Signed   By: Margarette Canada M.D.   On: 11/19/2015 17:02    EKG: Independently reviewed. Sinus rhythm, nonspecific T-wave changes    Time spent:60 minutes Code Status:   FULL Family Communication:  No Family at bedside Disposition Plan: expect 2 day hospitalization Consults called: [facility] cardiology DVT Prophylaxis:  Lovenox  Ramina Hulet, DO  [facility] Pager [phone]  If 7PM-7AM, please contact night-coverage [email or web address] Password  [facility] 11/19/2015, 6:40 PM

## 2015-11-19 NOTE — ED Triage Notes (Signed)
The pt is c/o chest pain since 0500am today with some sob  No previous history

## 2015-11-19 NOTE — ED Notes (Signed)
Patient is legally blind.

## 2015-11-19 NOTE — Consult Note (Signed)
CARDIOLOGY CONSULT NOTE   Patient ID: Tracy Bruce MRN: [id number] DOB/AGE: 08/19/1945 70 y.o.  Admit date: 11/19/2015  Primary Physician   Gavin Pound, MD Primary Cardiologist   Dr Wynonia Lawman Reason for Consultation   Chest pain Requesting MD: Dr Tat  [id number] Tracy Bruce is a 70 y.o. year old female with a history of HTN, mildly elevated cholesterol, GERD, retinitis pigmentosa. She was evaluated by Dr Johnsie Cancel in 2015 for palpitations, an echo was normal.   Today she had chest pain for the first time. The pain is described as a pounding pain as if it is pressing down and began around 5:00 this morning.  She has a L shoulder lipoma and thought she laid on it wrong, but it did not improve with movement. She was also SOB. She drank some coffee, took no meds for the pain. She tried to exercise by going upstairs but the pain became worse. She was also SOB. No N&V, mild diaphoresis.  The pain may have radiated somewhat into her shoulders.   When sx did not resolve, she went to her primary MD. ECG there was OK, but further evaluation was felt needed and she was sent to the ER. In the MD's office, she got ASA and SL NTG x 1. The pain eased some after about 5 minutes. It was 8/10 at its worst, 5/10 after the NTG. In the ER, the pain gradually increased back to an 8/10.  The pain is slightly pleuritic in nature.  Somewhat worse if she sits up.  Does not radiate into the back.  Never had before. No recent change in her ability to exert herself. No LE edema, orthopnea or PND. No palpitations, no presyncope.  She has no recent viral infections or upper respiratory infections.  No significant joint problems.   Past Medical History:  Diagnosis Date  . Arthritis   . Dysphagia, unspecified(787.20) 07/29/12  . GERD (gastroesophageal reflux disease)   . H/O colonoscopy   . Hypertension   . Legally blind   . Migraine   . Retinitis pigmentosa      Past Surgical History:  Procedure Laterality Date    . ABDOMINAL HYSTERECTOMY    . CATARACT EXTRACTION    . LASER LAPAROSCOPY      No Known Allergies  I have reviewed the patient's current medications  . nitroGLYCERIN     Prior to Admission medications   Medication Sig Start Date End Date Taking? Authorizing Provider  Ascorbic Acid (VITAMIN C PO) Take 1 tablet by mouth daily.   Yes Historical Provider, MD  aspirin 81 MG chewable tablet Chew 81 mg by mouth daily.   Yes Historical Provider, MD  co-enzyme Q-10 30 MG capsule Take 30 mg by mouth daily.   Yes Historical Provider, MD  ibuprofen (ADVIL,MOTRIN) 200 MG tablet Take 200 mg by mouth every 6 (six) hours as needed.   Yes Historical Provider, MD  lisinopril-hydrochlorothiazide (PRINZIDE,ZESTORETIC) 20-25 MG per tablet Take 1 tablet by mouth daily.   Yes Historical Provider, MD  NON [place]   Yes Historical Provider, MD  Omega-3 Fatty Acids (FISH OIL) 1000 MG CAPS Take by mouth.   Yes Historical Provider, MD  vitamin A 10000 UNIT capsule Take 10,000 Units by mouth daily.   Yes Historical Provider, MD  vitamin B-12 (CYANOCOBALAMIN) 1000 MCG tablet Take 1,000 mcg by mouth daily.   Yes Historical Provider, MD     Social History   Social History  .  Marital status: Divorced    Spouse name: N/A  . Number of children: 3  . Years of education: N/A   Occupational History  . Disabled    Social History Main Topics  . Smoking status: Former Smoker    Packs/day: 0.33    Quit date: 03/21/2003  . Smokeless tobacco: Never Used  . Alcohol use 0.0 oz/week     Comment: glass of wine monthly   . Drug use: No  . Sexual activity: Not on file   Other Topics Concern  . Not on file   Social History Narrative   Pt brother lives with her. She takes care of him. Her cousin drives for her.     Family Status  Relation Status  . Mother Deceased  . Father Deceased  . Brother Deceased  . Brother Deceased  . Brother Deceased  . Brother Alive  . Brother Alive  . Maternal Aunt    . Maternal Uncle   . Cousin   . Brother    Family History  Problem Relation Age of Onset  . Heart disease Mother   . Lung cancer Father   . Lung cancer Brother   . Liver disease Brother   . Alcohol abuse Brother   . Alcohol abuse Brother   . Alcohol abuse Brother   . Alcohol abuse Brother   . Schizophrenia Brother   . Drug abuse Brother   . Colon cancer Maternal Aunt   . Colon cancer Maternal Uncle   . Colon polyps Cousin   . Alcohol abuse Brother      ROS:  Full 14 point review of systems complete and found to be negative unless listed above.  Physical Exam: Blood pressure 138/88, pulse 76, temperature 99.3 F (37.4 C), resp. rate 15, height 5\' 3"  (1.6 m), weight 169 lb 9 oz (76.9 kg), SpO2 98 %.  General: Pleasant thin black female currently in no acute distress  Head: Eyes PERRLA, No xanthomas.   Normocephalic and atraumatic, oropharynx without edema or exudate. Dentition: good Lungs: Clear Heart: HRRR S1 S2, +rub/ no gallop, soft murrmur.  Scratchy 3 component pericardial rub heard best at the upper sternal area pulses are 2+ all 4 extrem.   Neck: No carotid bruits. No lymphadenopathy.  JVD not elevated Abdomen: Bowel sounds present, abdomen soft and non-tender without masses or hernias noted. Msk:  No spine or cva tenderness. No weakness, no joint deformities or effusions. Extremities: No clubbing or cyanosis. No edema.  Neuro: Alert and oriented X 3. No focal deficits noted. Psych:  Good affect, responds appropriately Skin: No rashes or lesions noted.  Labs:   Lab Results  Component Value Date   WBC 12.9 (H) 11/19/2015   HGB 13.5 11/19/2015   HCT 40.5 11/19/2015   MCV 90.2 11/19/2015   PLT 340 11/19/2015     Recent Labs Lab 11/19/15 1644  NA 138  K 3.5  CL 104  CO2 25  BUN 8  CREATININE 0.96  CALCIUM 10.0  GLUCOSE 104*    Recent Labs  11/19/15 [place] <0.03   Echo: 2015 Left ventricle: The cavity size was normal. Wall thickness was  normal. Systolic function was normal. The estimated ejection fraction was in the range of 55% to 60%. Wall motion was normal; there were no regional wall motion abnormalities. There was an increased relative contribution of atrial contraction to ventricular filling.  ECG:  09/01 SR, rate 97  Radiology:  Dg Chest 2 View  Result  Date: 11/19/2015 CLINICAL DATA:  70 year old female with acute chest pain today. EXAM: CHEST  2 VIEW COMPARISON:  None. FINDINGS: Mild cardiomegaly noted. There is no evidence of focal airspace disease, pulmonary edema, suspicious pulmonary nodule/mass, pleural effusion, or pneumothorax. No acute bony abnormalities are identified. IMPRESSION: Mild cardiomegaly without evidence of active cardiopulmonary disease. Electronically Signed   By: Margarette Canada M.D.   On: 11/19/2015 17:02    ASSESSMENT AND PLAN:   The patient was seen today by Dr Wynonia Lawman, the patient evaluated and the data reviewed.  Active Problems:   Unstable angina (HCC) - rub on exam more consistent w/ pericarditis  Otherwise, per IM   Essential hypertension   Leukocytosis   Dyslipidemia   Signed: Lenoard Aden 11/19/2015 7:23 PM Beeper [id number]  Co-Sign MD  Patient seen and examined, history and physical were updated as above.  Impressions:  1.  Continuous chest discomfort this morning with some pleuritic features with a fairly benign EKG and initial troponin negative.  She has a pericardial rub and I think the pain is more suggestive of pericarditis although somewhat atypical  In some features. 2.  History of hypertension 3.  Leukocytosis 4.  Dyslipidemia  Recommendations:  The pain has been continuous since this morning and initial troponin is negative.  I think this is more consistent with pericarditis.  My recommendations would be as follows:  1.  Obtain a CT angiogram to be sure she has not had a dissection that could've gotten into the pericardium 2.  Follow-up echocardiogram  early in the morning 3.  Obtain sedimentation rate 4.  Treat with nonsteroidal anti-inflammatories and begin colchicine.  Use morphine for pain as needed. 5.  Obtain serial troponins. 6.  for now would not anticoagulate  W. Tollie Eth, Brooke Bonito. MD [facility] 11/19/2015 8:13 PM

## 2015-11-20 ENCOUNTER — Encounter (HOSPITAL_COMMUNITY): Payer: Medicare Other

## 2015-11-20 ENCOUNTER — Other Ambulatory Visit (HOSPITAL_COMMUNITY): Payer: Medicare Other

## 2015-11-20 ENCOUNTER — Inpatient Hospital Stay (HOSPITAL_COMMUNITY): Payer: Medicare Other

## 2015-11-20 DIAGNOSIS — I200 Unstable angina: Secondary | ICD-10-CM

## 2015-11-20 DIAGNOSIS — E785 Hyperlipidemia, unspecified: Secondary | ICD-10-CM

## 2015-11-20 DIAGNOSIS — D72829 Elevated white blood cell count, unspecified: Secondary | ICD-10-CM

## 2015-11-20 DIAGNOSIS — I10 Essential (primary) hypertension: Secondary | ICD-10-CM

## 2015-11-20 DIAGNOSIS — R072 Precordial pain: Secondary | ICD-10-CM

## 2015-11-20 LAB — BASIC METABOLIC PANEL
Anion gap: 7 (ref 5–15)
BUN: 11 mg/dL (ref 6–20)
CHLORIDE: 108 mmol/L (ref 101–111)
CO2: 24 mmol/L (ref 22–32)
CREATININE: 0.85 mg/dL (ref 0.44–1.00)
Calcium: 9.6 mg/dL (ref 8.9–10.3)
Glucose, Bld: 92 mg/dL (ref 65–99)
POTASSIUM: 3.3 mmol/L — AB (ref 3.5–5.1)
SODIUM: 139 mmol/L (ref 135–145)

## 2015-11-20 LAB — HEPATIC FUNCTION PANEL
ALBUMIN: 3.4 g/dL — AB (ref 3.5–5.0)
ALT: 12 U/L — AB (ref 14–54)
AST: 15 U/L (ref 15–41)
Alkaline Phosphatase: 57 U/L (ref 38–126)
BILIRUBIN DIRECT: 0.1 mg/dL (ref 0.1–0.5)
Indirect Bilirubin: 0.6 mg/dL (ref 0.3–0.9)
Total Bilirubin: 0.7 mg/dL (ref 0.3–1.2)
Total Protein: 7.4 g/dL (ref 6.5–8.1)

## 2015-11-20 LAB — LIPID PANEL
Cholesterol: 183 mg/dL (ref 0–200)
HDL: 49 mg/dL (ref >40)
LDL Cholesterol: 125 mg/dL — ABNORMAL HIGH (ref 0–99)
Total CHOL/HDL Ratio: 3.7 RATIO
Triglycerides: 44 mg/dL (ref <150)
VLDL: 9 mg/dL (ref 0–40)

## 2015-11-20 LAB — MRSA PCR SCREENING: MRSA by PCR: NEGATIVE

## 2015-11-20 LAB — TROPONIN I
Troponin I: <0.03 ng/mL (ref <0.03)
Troponin I: <0.03 ng/mL (ref <0.03)

## 2015-11-20 LAB — URINALYSIS, ROUTINE W REFLEX MICROSCOPIC
Bilirubin Urine: NEGATIVE
GLUCOSE, UA: NEGATIVE mg/dL
Hgb urine dipstick: NEGATIVE
Ketones, ur: NEGATIVE mg/dL
LEUKOCYTES UA: NEGATIVE
Nitrite: NEGATIVE
PH: 6 (ref 5.0–8.0)
Protein, ur: NEGATIVE mg/dL
Specific Gravity, Urine: >1.046 — ABNORMAL HIGH (ref 1.005–1.030)

## 2015-11-20 LAB — CBC
HCT: 38.9 % (ref 36.0–46.0)
HEMOGLOBIN: 13 g/dL (ref 12.0–15.0)
MCH: 30.3 pg (ref 26.0–34.0)
MCHC: 33.4 g/dL (ref 30.0–36.0)
MCV: 90.7 fL (ref 78.0–100.0)
PLATELETS: 297 10*3/uL (ref 150–400)
RBC: 4.29 MIL/uL (ref 3.87–5.11)
RDW: 14 % (ref 11.5–15.5)
WBC: 8.6 10*3/uL (ref 4.0–10.5)

## 2015-11-20 LAB — T4, FREE: FREE T4: 1.05 ng/dL (ref 0.61–1.12)

## 2015-11-20 LAB — TSH: TSH: 0.269 u[IU]/mL — ABNORMAL LOW (ref 0.350–4.500)

## 2015-11-20 MED ORDER — COLCHICINE 0.6 MG PO TABS
0.6000 mg | ORAL_TABLET | Freq: Two times a day (BID) | ORAL | Status: DC
  Administered 2015-11-20 – 2015-11-21 (×3): 0.6 mg via ORAL
  Filled 2015-11-20 (×3): qty 1

## 2015-11-20 MED ORDER — SIMETHICONE 80 MG PO CHEW
80.0000 mg | CHEWABLE_TABLET | Freq: Four times a day (QID) | ORAL | Status: DC | PRN
  Administered 2015-11-20 (×3): 80 mg via ORAL
  Filled 2015-11-20 (×3): qty 1

## 2015-11-20 NOTE — Progress Notes (Signed)
Cardiology Rounding Note   Subjective:    Continues with CP. Worse with lying down and any movement.   Trop negative. ESR 29.   CT chest reviewed personally. No PE or dissection (severeal nodules). No coronary calcifications    Objective:   Weight Range:  Vital Signs:   Temp:  [97.5 F (36.4 C)-99.3 F (37.4 C)] 97.8 F (36.6 C) (09/02 0824) Pulse Rate:  [67-106] 67 (09/02 0500) Resp:  [15-30] 19 (09/02 0500) BP: (97-154)/(61-105) 125/89 (09/02 0824) SpO2:  [95 %-100 %] 98 % (09/02 0500) Weight:  [75.1 kg (165 lb 9.6 oz)-76.9 kg (169 lb 9 oz)] 75.1 kg (165 lb 9.6 oz) (09/01 2307) Last BM Date: 11/19/15  Weight change: Filed Weights   11/19/15 1635 11/19/15 2307  Weight: 76.9 kg (169 lb 9 oz) 75.1 kg (165 lb 9.6 oz)    Intake/Output:   Intake/Output Summary (Last 24 hours) at 11/20/15 1052 Last data filed at 11/20/15 0600  Gross per 24 hour  Intake                0 ml  Output              250 ml  Net             -250 ml     Physical Exam: General:  Lying in bed NAD HEENT: normal Neck: supple. JVPflat . Carotids 2+ bilat; no bruits. No lymphadenopathy or thryomegaly appreciated. Cor: PMI nondisplaced. Regular rate & rhythm. No rubs, gallops or murmurs. Lungs: clear Abdomen: soft, nontender, nondistended. No hepatosplenomegaly. No bruits or masses. Good bowel sounds. Extremities: no cyanosis, clubbing, rash, edema Neuro: alert & orientedx3, cranial nerves grossly intact. moves all 4 extremities w/o difficulty. Affect pleasant  Telemetry: NSR  Labs: Basic Metabolic Panel:  Recent Labs Lab 11/19/15 1644 11/20/15 0735  NA 138 139  K 3.5 3.3*  CL 104 108  CO2 25 24  GLUCOSE 104* 92  BUN 8 11  CREATININE 0.96 0.85  CALCIUM 10.0 9.6    Liver Function Tests: No results for input(s): AST, ALT, ALKPHOS, BILITOT, PROT, ALBUMIN in the last 168 hours. No results for input(s): LIPASE, AMYLASE in the last 168 hours. No results for input(s): AMMONIA in  the last 168 hours.  CBC:  Recent Labs Lab 11/19/15 1644 11/20/15 0735  WBC 12.9* 8.6  HGB 13.5 13.0  HCT 40.5 38.9  MCV 90.2 90.7  PLT 340 297    Cardiac Enzymes:  Recent Labs Lab 11/19/15 2033 11/19/15 2319 11/20/15 0208 11/20/15 0523 11/20/15 0735  TROPONINI <0.03 <0.03 <0.03 <0.03 <0.03    BNP: BNP (last 3 results) No results for input(s): BNP in the last 8760 hours.  ProBNP (last 3 results) No results for input(s): PROBNP in the last 8760 hours.    Other results:  Imaging: Dg Chest 2 View  Result Date: 11/19/2015 CLINICAL DATA:  70 year old female with acute chest pain today. EXAM: CHEST  2 VIEW COMPARISON:  None. FINDINGS: Mild cardiomegaly noted. There is no evidence of focal airspace disease, pulmonary edema, suspicious pulmonary nodule/mass, pleural effusion, or pneumothorax. No acute bony abnormalities are identified. IMPRESSION: Mild cardiomegaly without evidence of active cardiopulmonary disease. Electronically Signed   By: Margarette Canada M.D.   On: 11/19/2015 17:02   Ct Angio Chest Aorta W/cm &/or Wo/cm  Result Date: 11/19/2015 CLINICAL DATA:  Acute onset of generalized chest pain. Initial encounter. EXAM: CT ANGIOGRAPHY CHEST WITH CONTRAST TECHNIQUE: Multidetector CT imaging of the  chest was performed using the standard protocol during bolus administration of intravenous contrast. Multiplanar CT image reconstructions and MIPs were obtained to evaluate the vascular anatomy. CONTRAST:  100 mL of Isovue 370 IV contrast COMPARISON:  Chest radiograph performed earlier today at 4:42 p.m. FINDINGS: There is no evidence of aortic dissection. There is no evidence of aneurysmal dilatation. No calcific atherosclerotic disease is seen. The great vessels are grossly unremarkable in appearance. There is no evidence of significant pulmonary embolus. Mild bibasilar atelectasis is noted. The lungs are otherwise clear. There is no evidence of significant focal consolidation,  pleural effusion or pneumothorax. No masses are identified; no abnormal focal contrast enhancement is seen. The mediastinum is unremarkable in appearance. No mediastinal lymphadenopathy is seen. No pericardial effusion is identified. No axillary lymphadenopathy is seen. A 2.2 cm hypodense lesion is noted arising at the right thyroid lobe. There is a 1.9 cm peripherally calcified soft tissue density nodule at the right breast, about the 7 to 8 o'clock position, of uncertain significance. A nonspecific 1.1 cm hypodensity is noted within the right hepatic lobe. The visualized portions of the spleen are unremarkable. The visualized portions of the pancreas, adrenal glands and kidneys are within normal limits. A tiny hiatal hernia is noted. No acute osseous abnormalities are seen. Review of the MIP images confirms the above findings. IMPRESSION: 1. No evidence of aortic dissection. No evidence of aneurysmal dilatation. No calcific atherosclerotic disease seen. 2. No evidence of significant pulmonary embolus. 3. Mild bibasilar atelectasis noted.  Lungs otherwise clear. 4. **An incidental finding of potential clinical significance has been found. 2.2 cm hypodense lesion arising at the right thyroid lobe. Consider further evaluation with thyroid ultrasound. If patient is clinically hyperthyroid, consider nuclear medicine thyroid uptake and scan.** 5. **An incidental finding of potential clinical significance has been found. 1.1 cm nonspecific hypodensity at the right hepatic lobe. Would correlate with LFTs, as deemed clinically appropriate.** 6. 1.9 cm peripherally calcified soft tissue density nodule at the right breast, about the 7 to 8 o'clock position, of uncertain significance. Would correlate with prior mammogram, and consider diagnostic mammogram for further evaluation. Electronically Signed   By: Garald Balding M.D.   On: 11/19/2015 22:09      Medications:     Scheduled Medications: . enoxaparin (LOVENOX)  injection  40 mg Subcutaneous Q24H  . ibuprofen  600 mg Oral TID  . lisinopril  20 mg Oral Daily  . sodium chloride flush  3 mL Intravenous Q12H     Infusions:     PRN Medications:  acetaminophen **OR** acetaminophen, morphine injection, ondansetron **OR** ondansetron (ZOFRAN) IV   Assessment:   1. Chest pain, positional 2. HTN, controlled 3. Legal blindness  Plan/Discussion:     ECG, labs and chest CT reviewed personally.  ECG and troponin negative despite ongoing pain and no coronary calcium on CT makes ACS very unlikely. CT negative for dissection or PE.   Symptoms suspicious for pericarditis but ESR is normal and ECG ok. May be musculoskeletal. Would treat with ibuprofen and colchicine for now. Await echo.   I do not think there is a role for further ischemic testing at this point.   Length of Stay: 1   Bensimhon, Daniel MD 11/20/2015, 10:52 AM  Advanced Heart Failure Team Pager [phone] (M-F; [place])  Please contact [facility] for night-coverage after hours (4p -7a ) and weekends on [email or web address]

## 2015-11-20 NOTE — Progress Notes (Signed)
[facility] TEAM 1 - Stepdown/ICU TEAM  Benard Rink  [id number] DOB: 03-07-1946 DOA: 11/19/2015 PCP: Anthoney Harada, MD    Brief Narrative:  70 y.o. female with history of hypertension and dyslipidemia who presented with chest discomfort that began when she woke up on the morning of 11/19/2015. She described it as a constant pressure-like sensation radiating to her shoulders and her jaw. She stated that this was worse when she had to climb 2 flights of stairs, and was associated w/ shortness of breath. She went to see her primary care provider on the afternoon of 11/19/2015. The patient was given sublingual nitroglycerin which relieved her chest discomfort although not completely. After a period of 30-60 minutes, the patient stated that her chest discomfort returned. She presented to the emergency department where workup revealed EKG with nonspecific T wave changes and initial troponin was negative. .  Subjective: The patient reports that her pain persists and has not significantly changed.  She now feels it is mostly due to "gas."  She denies shortness of breath fevers chills abdominal pain nausea or vomiting.  Assessment & Plan:  Chest pain - pericarditis  No aortic dissection or PE on CTa chest - EKG and troponins not suggestive of ACS - Cards following - no role for further ischemia w/u at this time - awaiting TTE - cont empiric tx for pericarditis at this time, but suspicion low for this as well w/ unremarkable EKG and essentially normal sed rate - await TTE to complete evaluation  2.2 cm hypodense lesion right thyroid lobe Obtain US of thyroid - check thyroid studies   1.1 cm nonspecific hypodensity right hepatic lobe Check LFTs  1.9 cm peripherally calcified soft tissue density right breast 7 to 8 o'clock position Patient informs me that she had a mammogram and further detailed imaging with ultrasound approximately 6 months ago and was ultimately told everything looked  fine  HTN BP reasonably controlled at this time   Leukocytosis suspect stress demargination - UA unrevealing - CXR neg - WBC normalized   Leg Edema venous duplex r/o DVT pending   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 when testing complete  Consultants:  Cardiology   Procedures: TTE - Venous duplex -   Antimicrobials:  none   Objective: Blood pressure 97/74, pulse 67, temperature 98.5 F (36.9 C), temperature source Oral, resp. rate 19, height 5\' 3"  (1.6 m), weight 75.1 kg (165 lb 9.6 oz), SpO2 98 %.  Intake/Output Summary (Last 24 hours) at 11/20/15 0804 Last data filed at 11/20/15 0600  Gross per 24 hour  Intake                0 ml  Output              250 ml  Net             -250 ml   Filed Weights   11/19/15 1635 11/19/15 2307  Weight: 76.9 kg (169 lb 9 oz) 75.1 kg (165 lb 9.6 oz)    Examination: General: No acute respiratory distress Lungs: Clear to auscultation bilaterally without wheezes or crackles Cardiovascular: Regular rate and rhythm without murmur gallop or rub normal S1 and S2 Abdomen: Nontender, nondistended, soft, bowel sounds positive, no rebound, no ascites, no appreciable mass Extremities: No significant cyanosis, clubbing, or edema bilateral lower extremities  CBC:  Recent Labs Lab 11/19/15 1644  WBC 12.9*  HGB 13.5  HCT 40.5  MCV 90.2  PLT 123XX123   Basic Metabolic Panel:  Recent Labs Lab 11/19/15 1644  NA 138  K 3.5  CL 104  CO2 25  GLUCOSE 104*  BUN 8  CREATININE 0.96  CALCIUM 10.0   GFR: Estimated Creatinine Clearance: 54.5 mL/min (by C-G formula based on SCr of 0.96 mg/dL).  Liver Function Tests: No results for input(s): AST, ALT, ALKPHOS, BILITOT, PROT, ALBUMIN in the last 168 hours. No results for input(s): LIPASE, AMYLASE in the last 168 hours. No  results for input(s): AMMONIA in the last 168 hours.  Coagulation Profile: No results for input(s): INR, PROTIME in the last 168 hours.  Cardiac Enzymes:  Recent Labs Lab 11/19/15 1644 11/19/15 2033 11/19/15 2319 11/20/15 0208 11/20/15 0523  TROPONINI <0.03 <0.03 <0.03 <0.03 <0.03    HbA1C: No results found for: HGBA1C  CBG: No results for input(s): GLUCAP in the last 168 hours.  Recent Results (from the past 240 hour(s))  MRSA PCR Screening     Status: None   Collection Time: 11/19/15 11:01 PM  Result Value Ref Range Status   MRSA by PCR NEGATIVE NEGATIVE Final    Comment:        The GeneXpert MRSA Assay (FDA approved for NASAL specimens only), is one component of a comprehensive MRSA colonization surveillance program. It is not intended to diagnose MRSA infection nor to guide or monitor treatment for MRSA infections.      Scheduled Meds: . enoxaparin (LOVENOX) injection  40 mg Subcutaneous Q24H  . ibuprofen  600 mg Oral TID  . lisinopril  20 mg Oral Daily  . sodium chloride flush  3 mL Intravenous Q12H     LOS: 1 day   Cherene Altes, MD [facility] Office  [phone] Pager - Text Page per Amion as per below:  On-Call/Text Page:      [email or web address]      password [email or web address]  If 7PM-7AM, please contact night-coverage [email or web address] Password [email or web address] 11/20/2015, 8:04 AM

## 2015-11-21 ENCOUNTER — Inpatient Hospital Stay (HOSPITAL_COMMUNITY): Payer: Medicare Other

## 2015-11-21 DIAGNOSIS — M7989 Other specified soft tissue disorders: Secondary | ICD-10-CM

## 2015-11-21 DIAGNOSIS — M79609 Pain in unspecified limb: Secondary | ICD-10-CM

## 2015-11-21 DIAGNOSIS — R079 Chest pain, unspecified: Secondary | ICD-10-CM

## 2015-11-21 LAB — CBC
HCT: 40.3 % (ref 36.0–46.0)
Hemoglobin: 13.1 g/dL (ref 12.0–15.0)
MCH: 29.4 pg (ref 26.0–34.0)
MCHC: 32.5 g/dL (ref 30.0–36.0)
MCV: 90.6 fL (ref 78.0–100.0)
PLATELETS: 313 10*3/uL (ref 150–400)
RBC: 4.45 MIL/uL (ref 3.87–5.11)
RDW: 13.6 % (ref 11.5–15.5)
WBC: 10.9 10*3/uL — ABNORMAL HIGH (ref 4.0–10.5)

## 2015-11-21 LAB — URINE CULTURE

## 2015-11-21 LAB — BASIC METABOLIC PANEL
Anion gap: 11 (ref 5–15)
BUN: 10 mg/dL (ref 6–20)
CALCIUM: 9.5 mg/dL (ref 8.9–10.3)
CO2: 22 mmol/L (ref 22–32)
CREATININE: 0.94 mg/dL (ref 0.44–1.00)
Chloride: 106 mmol/L (ref 101–111)
GFR calc Af Amer: >60 mL/min (ref >60)
GLUCOSE: 81 mg/dL (ref 65–99)
Potassium: 3.1 mmol/L — ABNORMAL LOW (ref 3.5–5.1)
Sodium: 139 mmol/L (ref 135–145)

## 2015-11-21 LAB — ECHOCARDIOGRAM COMPLETE
HEIGHTINCHES: 63 in
Weight: 2649.6 oz

## 2015-11-21 MED ORDER — POTASSIUM CHLORIDE CRYS ER 20 MEQ PO TBCR: 40.0000 meq | EXTENDED_RELEASE_TABLET | Freq: Once | ORAL | Status: DC

## 2015-11-21 NOTE — Progress Notes (Signed)
Discharged home in stable condition. Ambulated from unit to vehicle with Audrea Muscat, her cousin.

## 2015-11-21 NOTE — Progress Notes (Signed)
Cardiology Rounding Note   Subjective:    Chest pain completely resolved. She thinks it was gas,   Echo normal this am. No wall motion abnormality. No effusion   Trop negative. ESR 29.   CT chest reviewed personally. No PE or dissection (severeal nodules). No coronary calcifications    Objective:   Weight Range:  Vital Signs:   Temp:  [97.5 F (36.4 C)-98.2 F (36.8 C)] 97.5 F (36.4 C) (09/03 0800) Pulse Rate:  [68-100] 80 (09/03 0600) Resp:  [12-28] 17 (09/03 0600) BP: (99-153)/(69-100) 117/88 (09/03 0936) SpO2:  [90 %-99 %] 99 % (09/03 0600) Last BM Date: 11/19/15  Weight change: Filed Weights   11/19/15 1635 11/19/15 2307  Weight: 76.9 kg (169 lb 9 oz) 75.1 kg (165 lb 9.6 oz)    Intake/Output:   Intake/Output Summary (Last 24 hours) at 11/21/15 1018 Last data filed at 11/21/15 0900  Gross per 24 hour  Intake              600 ml  Output             1670 ml  Net            -1070 ml     Physical Exam: General:  Sitting up eating breakfast  HEENT: normal Neck: supple. JVPflat . Carotids 2+ bilat; no bruits. No lymphadenopathy or thryomegaly appreciated. Cor: PMI nondisplaced. Regular rate & rhythm. No rubs, gallops or murmurs. Lungs: clear Abdomen: soft, nontender, nondistended. No hepatosplenomegaly. No bruits or masses. Good bowel sounds. Extremities: no cyanosis, clubbing, rash, edema Neuro: alert & orientedx3, cranial nerves grossly intact. moves all 4 extremities w/o difficulty. Affect pleasant  Telemetry: NSR  Labs: Basic Metabolic Panel:  Recent Labs Lab 11/19/15 1644 11/20/15 0735 11/21/15 0348  NA 138 139 139  K 3.5 3.3* 3.1*  CL 104 108 106  CO2 25 24 22   GLUCOSE 104* 92 81  BUN 8 11 10   CREATININE 0.96 0.85 0.94  CALCIUM 10.0 9.6 9.5    Liver Function Tests:  Recent Labs Lab 11/20/15 1611  AST 15  ALT 12*  ALKPHOS 57  BILITOT 0.7  PROT 7.4  ALBUMIN 3.4*   No results for input(s): LIPASE, AMYLASE in the last 168  hours. No results for input(s): AMMONIA in the last 168 hours.  CBC:  Recent Labs Lab 11/19/15 1644 11/20/15 0735 11/21/15 0348  WBC 12.9* 8.6 10.9*  HGB 13.5 13.0 13.1  HCT 40.5 38.9 40.3  MCV 90.2 90.7 90.6  PLT 340 297 313    Cardiac Enzymes:  Recent Labs Lab 11/19/15 2033 11/19/15 2319 11/20/15 0208 11/20/15 0523 11/20/15 0735  TROPONINI <0.03 <0.03 <0.03 <0.03 <0.03    BNP: BNP (last 3 results) No results for input(s): BNP in the last 8760 hours.  ProBNP (last 3 results) No results for input(s): PROBNP in the last 8760 hours.    Other results:  Imaging: Dg Chest 2 View  Result Date: 11/19/2015 CLINICAL DATA:  70 year old female with acute chest pain today. EXAM: CHEST  2 VIEW COMPARISON:  None. FINDINGS: Mild cardiomegaly noted. There is no evidence of focal airspace disease, pulmonary edema, suspicious pulmonary nodule/mass, pleural effusion, or pneumothorax. No acute bony abnormalities are identified. IMPRESSION: Mild cardiomegaly without evidence of active cardiopulmonary disease. Electronically Signed   By: Margarette Canada M.D.   On: 11/19/2015 17:02   Ct Angio Chest Aorta W/cm &/or Wo/cm  Result Date: 11/19/2015 CLINICAL DATA:  Acute onset of generalized chest  pain. Initial encounter. EXAM: CT ANGIOGRAPHY CHEST WITH CONTRAST TECHNIQUE: Multidetector CT imaging of the chest was performed using the standard protocol during bolus administration of intravenous contrast. Multiplanar CT image reconstructions and MIPs were obtained to evaluate the vascular anatomy. CONTRAST:  100 mL of Isovue 370 IV contrast COMPARISON:  Chest radiograph performed earlier today at 4:42 p.m. FINDINGS: There is no evidence of aortic dissection. There is no evidence of aneurysmal dilatation. No calcific atherosclerotic disease is seen. The great vessels are grossly unremarkable in appearance. There is no evidence of significant pulmonary embolus. Mild bibasilar atelectasis is noted. The lungs  are otherwise clear. There is no evidence of significant focal consolidation, pleural effusion or pneumothorax. No masses are identified; no abnormal focal contrast enhancement is seen. The mediastinum is unremarkable in appearance. No mediastinal lymphadenopathy is seen. No pericardial effusion is identified. No axillary lymphadenopathy is seen. A 2.2 cm hypodense lesion is noted arising at the right thyroid lobe. There is a 1.9 cm peripherally calcified soft tissue density nodule at the right breast, about the 7 to 8 o'clock position, of uncertain significance. A nonspecific 1.1 cm hypodensity is noted within the right hepatic lobe. The visualized portions of the spleen are unremarkable. The visualized portions of the pancreas, adrenal glands and kidneys are within normal limits. A tiny hiatal hernia is noted. No acute osseous abnormalities are seen. Review of the MIP images confirms the above findings. IMPRESSION: 1. No evidence of aortic dissection. No evidence of aneurysmal dilatation. No calcific atherosclerotic disease seen. 2. No evidence of significant pulmonary embolus. 3. Mild bibasilar atelectasis noted.  Lungs otherwise clear. 4. **An incidental finding of potential clinical significance has been found. 2.2 cm hypodense lesion arising at the right thyroid lobe. Consider further evaluation with thyroid ultrasound. If patient is clinically hyperthyroid, consider nuclear medicine thyroid uptake and scan.** 5. **An incidental finding of potential clinical significance has been found. 1.1 cm nonspecific hypodensity at the right hepatic lobe. Would correlate with LFTs, as deemed clinically appropriate.** 6. 1.9 cm peripherally calcified soft tissue density nodule at the right breast, about the 7 to 8 o'clock position, of uncertain significance. Would correlate with prior mammogram, and consider diagnostic mammogram for further evaluation. Electronically Signed   By: Garald Balding M.D.   On: 11/19/2015 22:09     US Thyroid  Result Date: 11/20/2015 CLINICAL DATA:  Thyroid goiter with suggestion of right thyroid nodule by chest CT. EXAM: THYROID ULTRASOUND TECHNIQUE: Ultrasound examination of the thyroid gland and adjacent soft tissues was performed. COMPARISON:  CTA of the chest on 11/19/2015 FINDINGS: Parenchymal Echotexture: Mildly heterogenous Estimated total number of nodules >/= 1 cm: 2 Number of spongiform nodules > 2 cm not described below (TR1): 0 Number of mixed cystic and solid nodules > 1.5 cm not described below ([place]): 0 _________________________________________________________ Isthmus: 0.8 cm Nodule # 1: Location: Isthmus; left portion Size: 0.6 x 0.4 x 0.4 cm Composition: cystic/almost completely cystic (0) Echogenicity: anechoic (0) Shape: not taller-than-wide (0) Margins: smooth (0) Echogenic foci: none (0) ACR TI-RADS total points: 0. ACR TI-RADS risk category: TR1 (0 points). ACR TI-RADS recommendations: This nodule does NOT meet TI-RADS criteria for biopsy or dedicated follow-up. Findings are consistent with a small colloid cyst. _________________________________________________________ Right lobe: 5.8 x 3.2 x 1.9 cm Nodule # 1: Location: Right; Inferior Size: 2.6 x 1.5 x 2.2 cm Composition: cystic/almost completely cystic (0) Echogenicity: anechoic (0) Shape: not taller-than-wide (0) Margins: smooth (0) Echogenic foci: comet-tail artifacts (0) ACR TI-RADS total  points: 0. ACR TI-RADS risk category: TR1 (0 points). ACR TI-RADS recommendations: This nodule does NOT meet TI-RADS criteria for biopsy or dedicated follow-up. Findings are consistent with a colloid cyst. Nodule # 2: Location: Right; Inferior Size: 1.2 x 0.6 x 1.2 cm Composition: spongiform (0) Echogenicity: anechoic (0) Shape: not taller-than-wide (0) Margins: smooth (0) Echogenic foci: none (0) ACR TI-RADS total points: 0. ACR TI-RADS risk category: TR1 (0 points). ACR TI-RADS recommendations: This nodule does NOT meet TI-RADS criteria for  biopsy or dedicated follow-up. This has the appearance of a spongiform colloid nodule. There are several other scattered subcentimeter cysts and spongiform nodules throughout the right lobe consistent with colloid nodules. _________________________________________________________ Left lobe: 5.3 x 2.2 x 1.8 cm Multiple sub cm cystic and spongiform nodules are consistent with colloid nodules and scattered throughout the left lobe. The largest measures 0.7 cm. These do not require biopsy or dedicated follow-up. 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 These are all benign in appearance and do not meet criteria for either biopsy or dedicated imaging follow-up. The above is in keeping with the ACR TI-RADS recommendations - J Am Coll Radiol 2017;14:587-595. Electronically Signed   By: Aletta Edouard M.D.   On: 11/20/2015 15:58     Medications:     Scheduled Medications: . colchicine  0.6 mg Oral BID  . enoxaparin (LOVENOX) injection  40 mg Subcutaneous Q24H  . ibuprofen  600 mg Oral TID  . lisinopril  20 mg Oral Daily  . sodium chloride flush  3 mL Intravenous Q12H    Infusions:    PRN Medications: acetaminophen **OR** acetaminophen, morphine injection, ondansetron **OR** ondansetron (ZOFRAN) IV, simethicone   Assessment:   1. Chest pain, positional 2. HTN, controlled 3. 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   CP resolved. All cardiac work-up is negative. Doubt cardiac in nature. Can stop NSAIDs and colchicine. [place] for d/c from our standpoint. F/u with PCP.   Length of Stay: 2   Glori Bickers MD 11/21/2015, 10:18 AM  Advanced Heart Failure Team Pager [phone] (M-F; [place])  Please contact [facility] for night-coverage after hours (4p -7a ) and weekends on [email or web address]

## 2015-11-21 NOTE — Discharge Summary (Signed)
DISCHARGE SUMMARY  Tracy Bruce  MR#: [id number]  DOB:March 11, 1946  Date of Admission: 11/19/2015 Date of Discharge: 11/21/2015  Attending Physician:Derril Franek T  Patient's [id number] PATRICK, MD  Consults:  [facility]  Disposition: D/C home   Follow-up Appts: Follow-up Information    WONG,FRANCIS PATRICK, MD Follow up in 1 week(s).   Specialty:  Family Medicine Contact information: [place] [address] [phone]           Tests Needing Follow-up: -assure pt continues w/ regular mammograms/breast CA screening -consider re-eval of R hepatic lobe lesion in 1 year   Discharge Diagnoses: Chest pain Multiple thyroid nodules  1.1 cm nonspecific hypodensity right hepatic lobe 1.9 cm peripherally calcified soft tissue density right breast 7 to 8 o'clock position HTN LE Edema Retinitis Pigmentosa  Initial presentation: 70 y.o.femalewith history ofhypertension and dyslipidemiawho presented with chest discomfort that began when she woke up on the morning of 11/19/2015. She described it as a constant pressure-like sensation radiating to her shoulders and her jaw. She stated that this was worse when she had to climb 2 flights of stairs, and was associated w/ shortness of breath. She went to see her primary care provider on the afternoon of 11/19/2015. The patient was given sublingual nitroglycerin which relieved her chest discomfort although not completely. After a period of 30-60 minutes, the patient stated that her chest discomfort returned. She presented to the emergency department where workup revealed EKG with nonspecific T wavechanges and initial troponin was negative. 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 These are all benign in appearance and do not meet criteria for either biopsy or dedicated imaging follow-up." - TSH low at 0.269 but FT4 normal at 1.05   1.1 cm nonspecific hypodensity right hepatic lobe LFTs normal - consider repeat imaging in one year   1.9 cm peripherally calcified soft tissue density right breast 7 to 8 o'clock position Patient informs me that she had a mammogram and further detailed imaging with ultrasound approximately 6 months ago and was ultimately told everything looked fine - she is encouraged to continue scheduled breast cancer screening  HTN BP controlled at time of d/c   Leukocytosis suspect stress demargination - UA unrevealing - CXR neg    Leg Edema venous duplex negative for DVT   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   co-enzyme Q-10 30 MG capsule Take 30 mg by mouth daily.   Fish Oil 1000 MG Caps Take by mouth.   ibuprofen 200 MG tablet Commonly known as:  ADVIL,MOTRIN Take 200 mg by mouth every 6 (six) hours as needed.   lisinopril-hydrochlorothiazide 20-25 MG tablet Commonly known as:  PRINZIDE,ZESTORETIC Take 1 tablet by mouth daily.   NON FORMULARY OMEGA RED DAILY   vitamin A 10000 UNIT capsule Take 10,000 Units by mouth daily.   vitamin B-12 1000 MCG tablet Commonly known as:  CYANOCOBALAMIN Take 1,000 mcg by mouth daily.   VITAMIN C PO Take 1 tablet by mouth daily.       Day  of Discharge BP 111/81   Pulse 72   Temp 97.8 F (36.6 C) (Oral)   Resp 18   Ht 5\' 3"  (1.6 m)   Wt 75.1 kg (165 lb 9.6 oz)   SpO2 99%   BMI 29.33 kg/m   Physical Exam: General: No acute respiratory distress Lungs: Clear to auscultation bilaterally without wheezes or crackles Cardiovascular: Regular rate and rhythm without murmur  gallop or rub normal S1 and S2 Abdomen: Nontender, nondistended, soft, bowel sounds positive, no rebound, no ascites, no appreciable mass Extremities: No significant cyanosis, clubbing, or edema bilateral lower extremities  Basic Metabolic Panel:  Recent Labs Lab 11/19/15 1644 11/20/15 0735 11/21/15 0348  NA 138 139 139  K 3.5 3.3* 3.1*  CL 104 108 106  CO2 25 24 22   GLUCOSE 104* 92 81  BUN 8 11 10   CREATININE 0.96 0.85 0.94  CALCIUM 10.0 9.6 9.5    Liver Function Tests:  Recent Labs Lab 11/20/15 1611  AST 15  ALT 12*  ALKPHOS 57  BILITOT 0.7  PROT 7.4  ALBUMIN 3.4*   CBC:  Recent Labs Lab 11/19/15 1644 11/20/15 0735 11/21/15 0348  WBC 12.9* 8.6 10.9*  HGB 13.5 13.0 13.1  HCT 40.5 38.9 40.3  MCV 90.2 90.7 90.6  PLT 340 297 313    Cardiac Enzymes:  Recent Labs Lab 11/19/15 2033 11/19/15 2319 11/20/15 0208 11/20/15 0523 11/20/15 0735  TROPONINI <0.03 <0.03 <0.03 <0.03 <0.03    Recent Results (from the past 240 hour(s))  MRSA PCR Screening     Status: None   Collection Time: 11/19/15 11:01 PM  Result Value Ref Range Status   MRSA by PCR NEGATIVE NEGATIVE Final    Comment:        The GeneXpert MRSA Assay (FDA approved for NASAL specimens only), is one component of a comprehensive MRSA colonization surveillance program. It is not intended to diagnose MRSA infection nor to guide or monitor treatment for MRSA infections.   Culture, Urine     Status: Abnormal   Collection Time: 11/20/15  5:44 AM  Result Value Ref Range Status   Specimen Description URINE, CLEAN CATCH  Final   Special Requests NONE  Final   Culture MULTIPLE SPECIES PRESENT, SUGGEST RECOLLECTION (A)  Final   Report Status 11/21/2015 FINAL  Final     11/21/2015, 3:41 PM   Cherene Altes, MD [facility] Office  [phone] Pager [phone]  On-Call/Text Page:      [email or web address]      password [facility]

## 2015-11-21 NOTE — Discharge Instructions (Signed)

## 2015-11-21 NOTE — Progress Notes (Signed)
  Echocardiogram 2D Echocardiogram has been performed.  Tracy Bruce 11/21/2015, 8:45 AM

## 2015-11-21 NOTE — Progress Notes (Signed)
VASCULAR LAB PRELIMINARY  PRELIMINARY  PRELIMINARY  PRELIMINARY  Bilateral lower extremity venous duplex completed.    Preliminary report:  There is no DVT or SVT noted in the bilateral lower extremities.   Koray Soter, RVT 11/21/2015, 10:05 AM

## 2015-11-23 LAB — HEMOGLOBIN A1C
Hgb A1c MFr Bld: 5.7 % — ABNORMAL HIGH (ref 4.8–5.6)
Mean Plasma Glucose: 117 mg/dL

## 2016-12-19 ENCOUNTER — Other Ambulatory Visit: Payer: Self-pay | Admitting: Family Medicine

## 2016-12-19 DIAGNOSIS — E041 Nontoxic single thyroid nodule: Secondary | ICD-10-CM

## 2016-12-26 ENCOUNTER — Ambulatory Visit
Admission: RE | Admit: 2016-12-26 | Discharge: 2016-12-26 | Disposition: A | Discharge status: Home / Self Care | Payer: Medicare Other | Source: Ambulatory Visit | Attending: Family Medicine | Admitting: Family Medicine

## 2016-12-26 DIAGNOSIS — E041 Nontoxic single thyroid nodule: Secondary | ICD-10-CM

## 2017-11-14 IMAGING — CR DG CHEST 2V
2 series · 2 of 2 positions shown · non-contrast
Comparison: None.

CLINICAL DATA: 70-year-old female with acute chest pain today.

EXAM:
CHEST  2 VIEW

[chest pa]
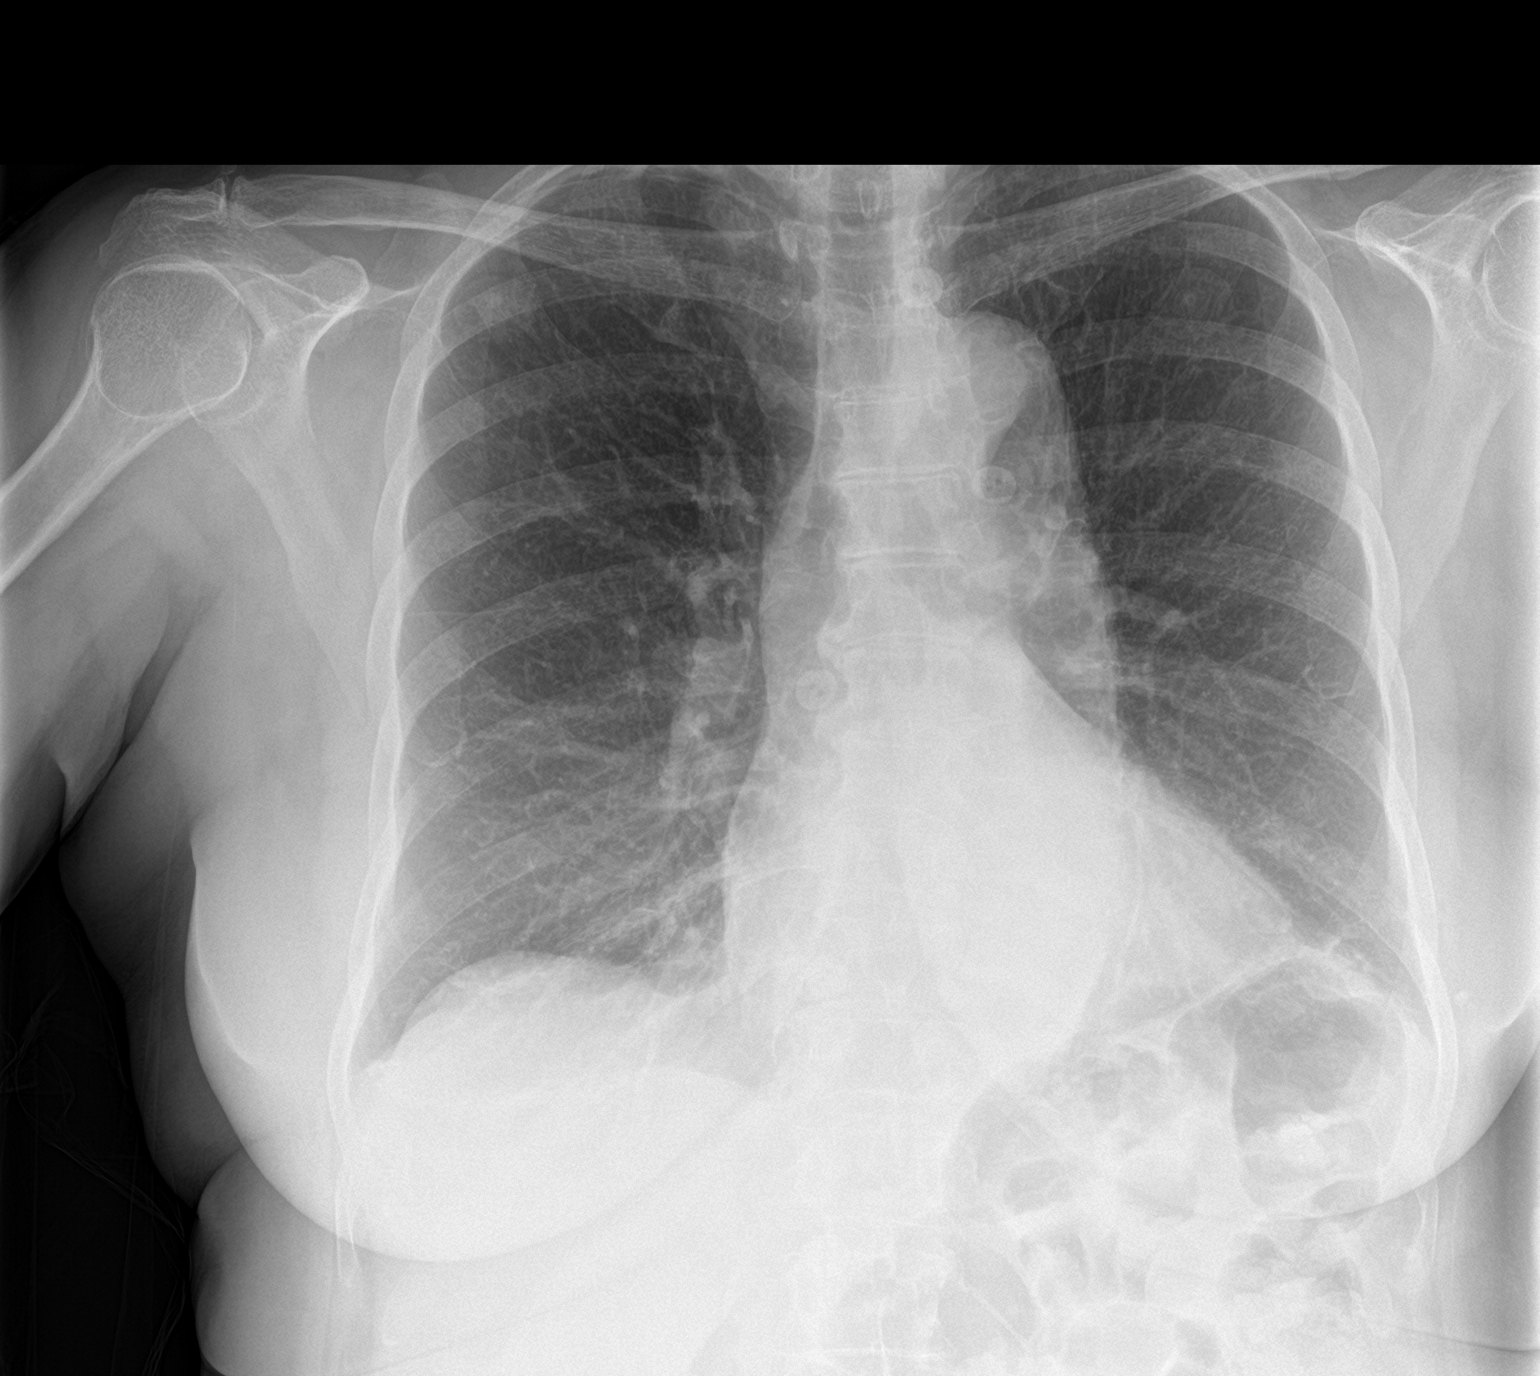

[chest lat]
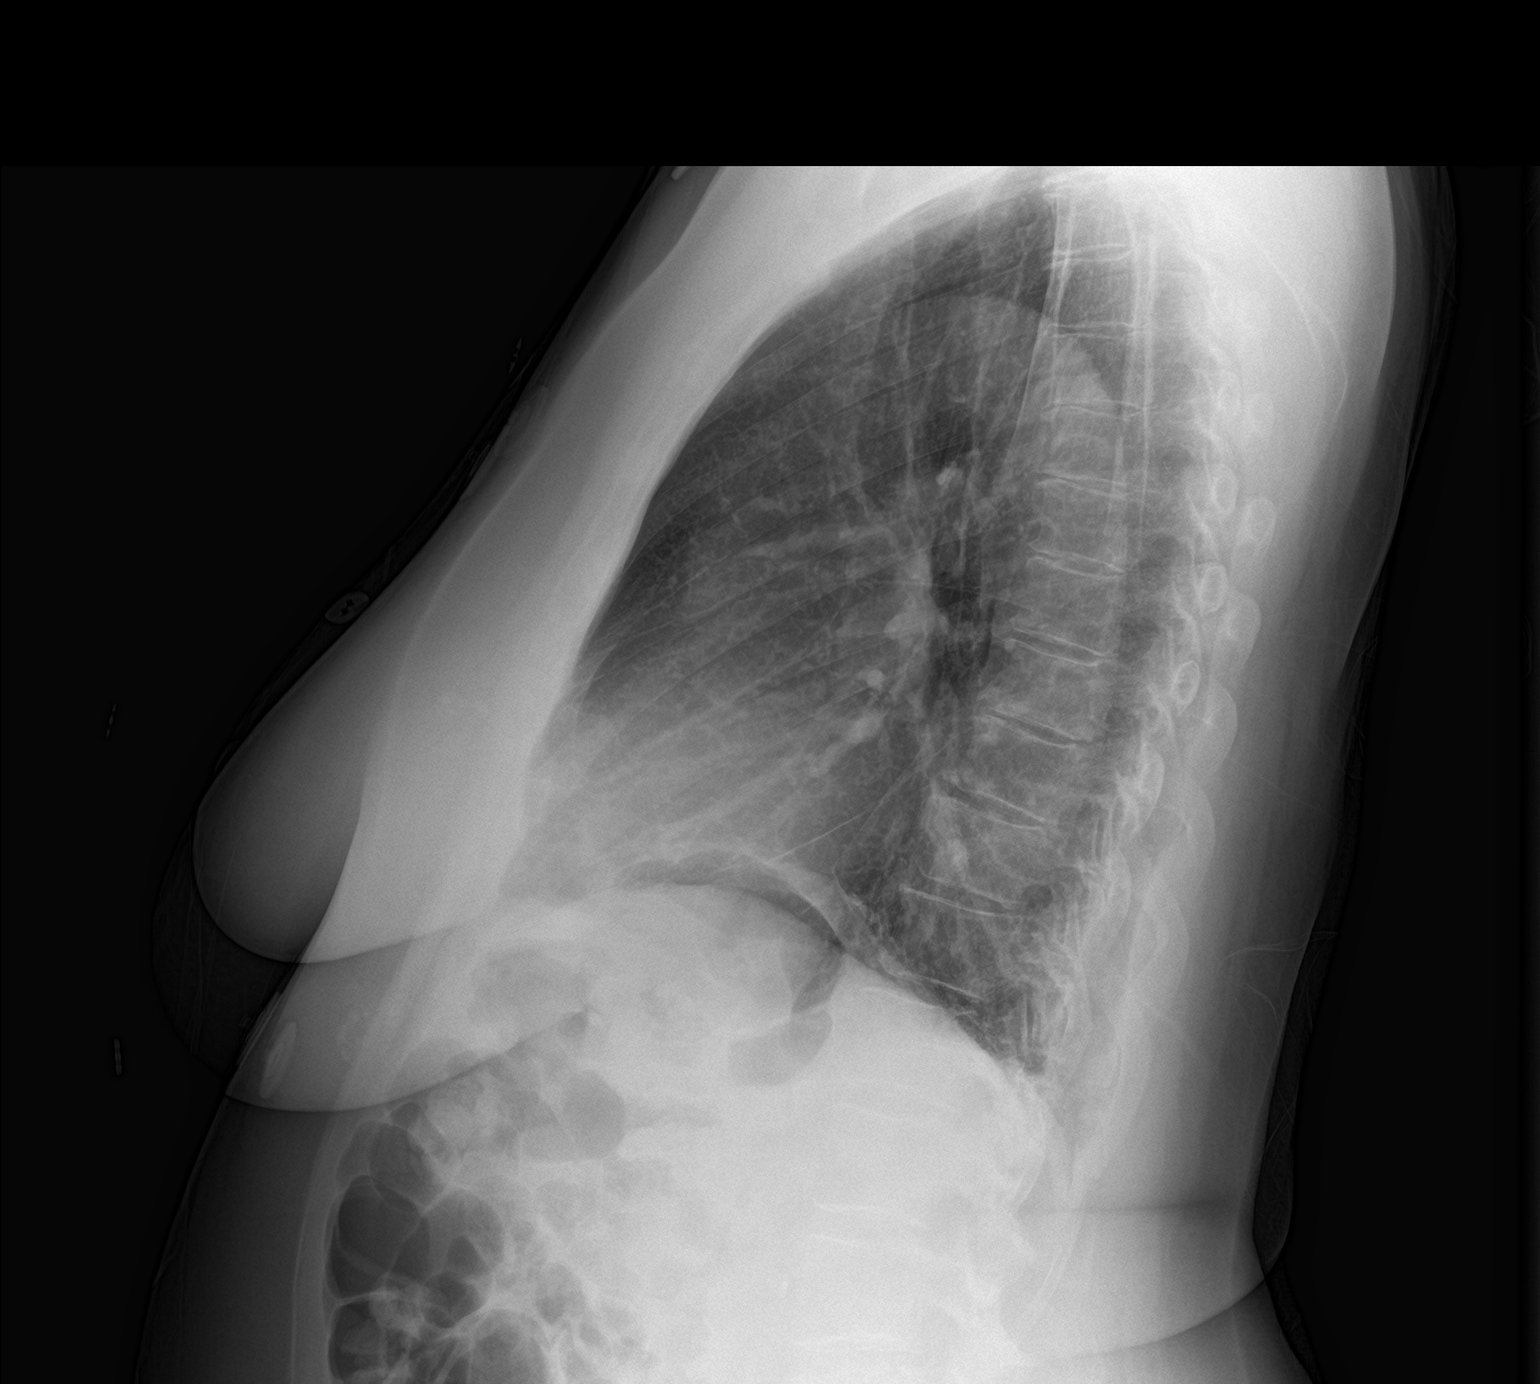

[2 of 2 positions shown; findings below may reference images not displayed]

FINDINGS: Mild cardiomegaly noted.

There is no evidence of focal airspace disease, pulmonary edema,
suspicious pulmonary nodule/mass, pleural effusion, or pneumothorax.
No acute bony abnormalities are identified.
IMPRESSION: Mild cardiomegaly without evidence of active cardiopulmonary
disease.

## 2017-11-14 IMAGING — CT CT ANGIO CHEST
2 of 6 series · 17 of 46 positions shown · IV contrast (isovue)
Comparison: Chest radiograph performed earlier today at [DATE] p.m.

CLINICAL DATA: Acute onset of generalized chest pain. Initial
encounter.

EXAM:
CT ANGIOGRAPHY CHEST WITH CONTRAST
TECHNIQUE: Multidetector CT imaging of the chest was performed using the
standard protocol during bolus administration of intravenous
contrast. Multiplanar CT image reconstructions and MIPs were
obtained to evaluate the vascular anatomy.
CONTRAST:  100 mL of Isovue 370 IV contrast

[Series 7: dissection 2mm · axial · 0.67mm/px · z∈[-242,-12]mm · 14 of 127 slices shown]
[im 6/127  lung]
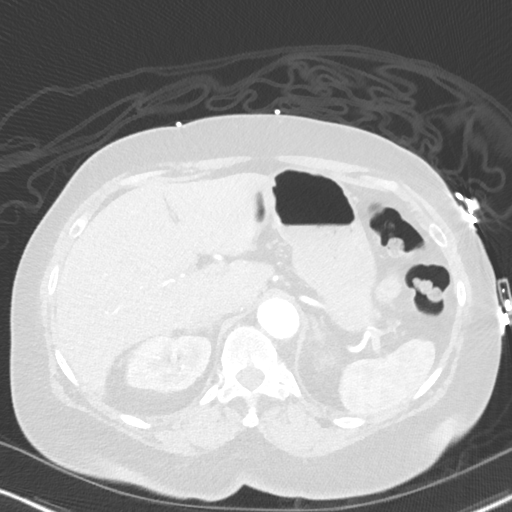
[im 18/127  soft-tissue]
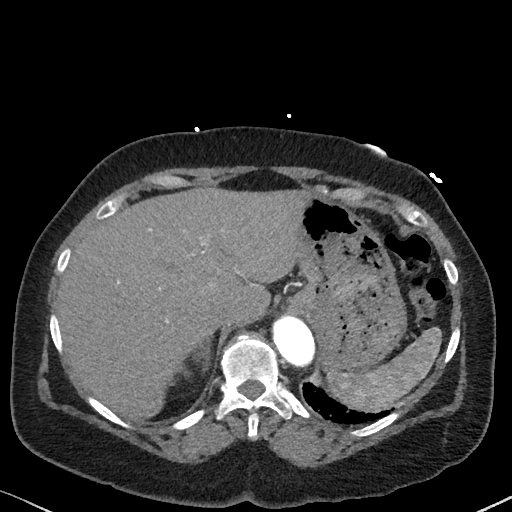
[im 23/127  lung]
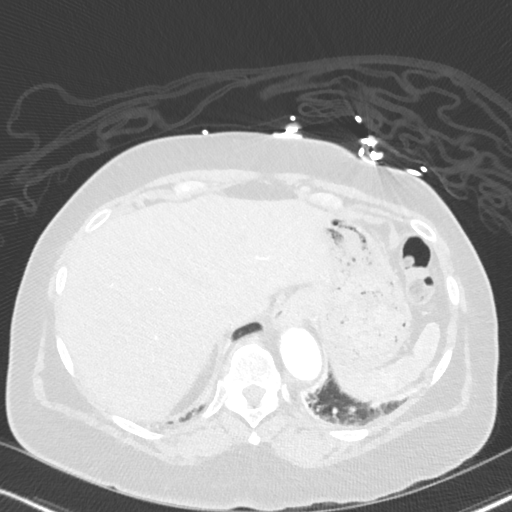
[im 35/127  soft-tissue]
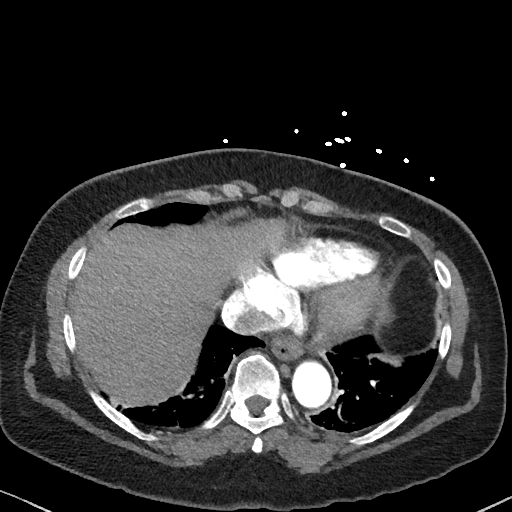
[im 41/127  lung]
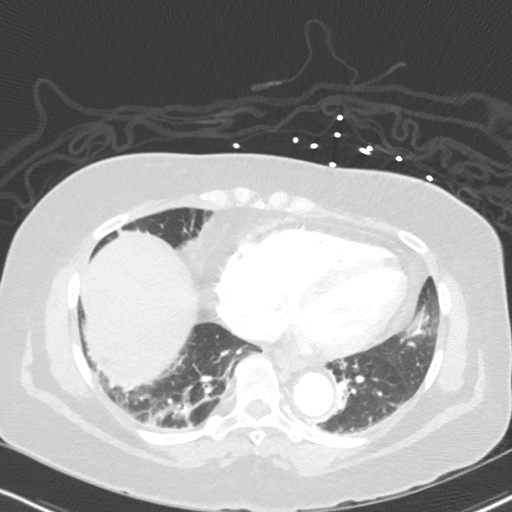
[im 52/127  soft-tissue]
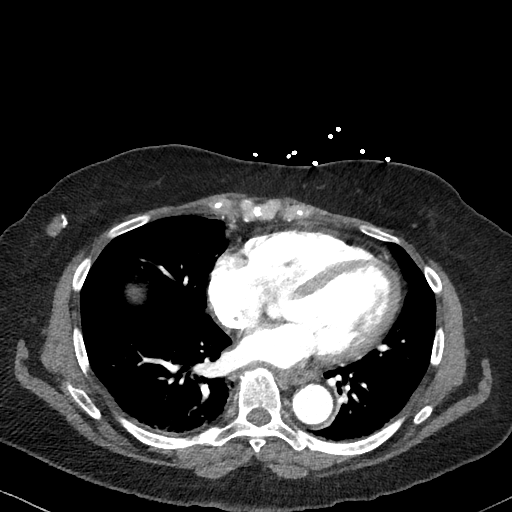
[im 58/127  lung]
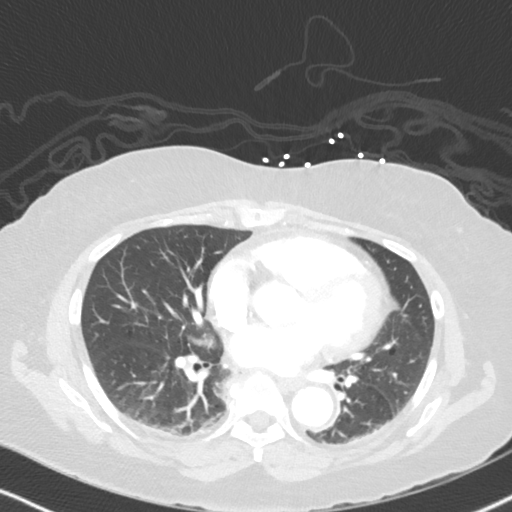
[im 69/127  soft-tissue]
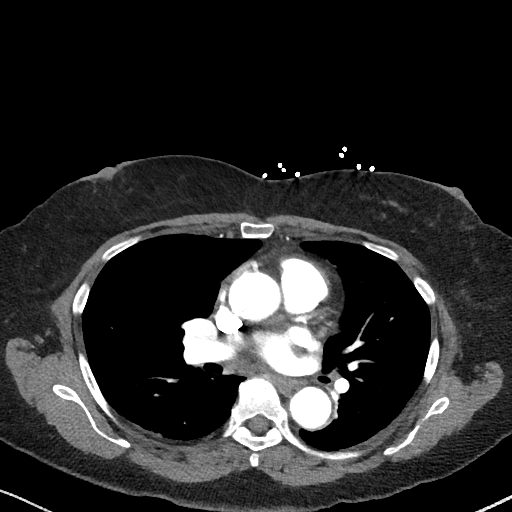
[im 75/127  lung]
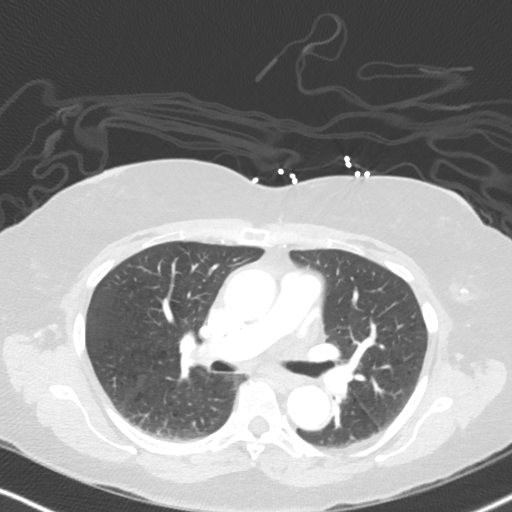
[im 86/127  soft-tissue]
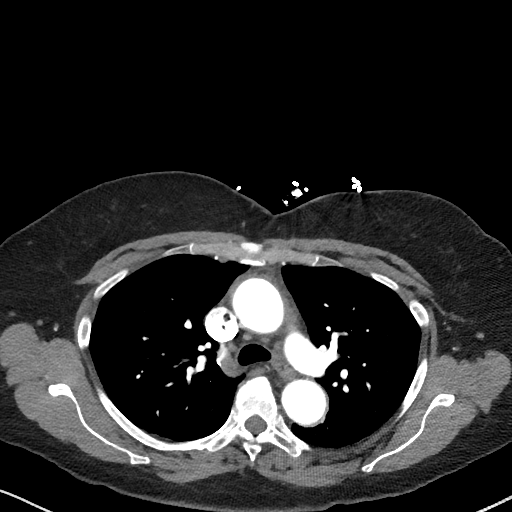
[im 92/127  lung]
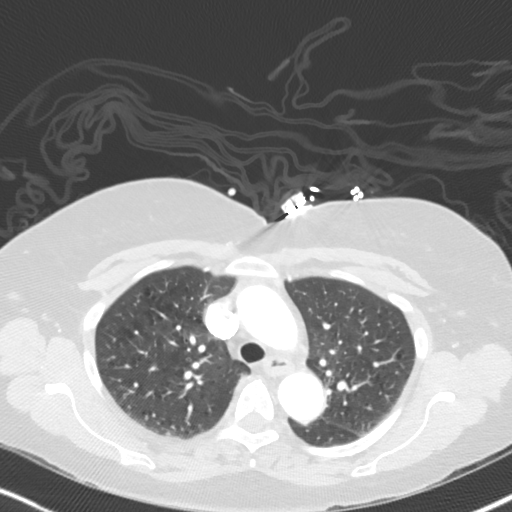
[im 104/127  soft-tissue]
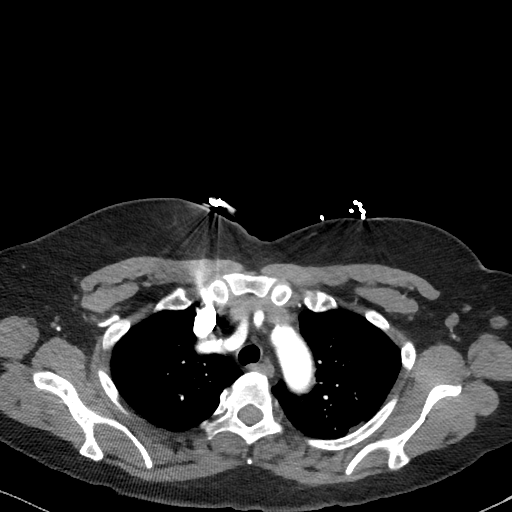
[im 109/127  lung]
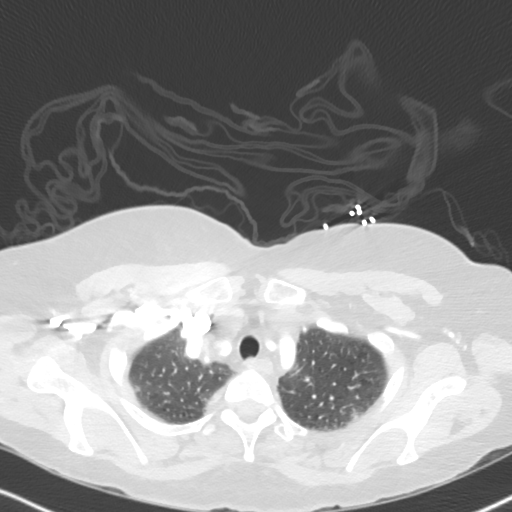
[im 121/127  soft-tissue]
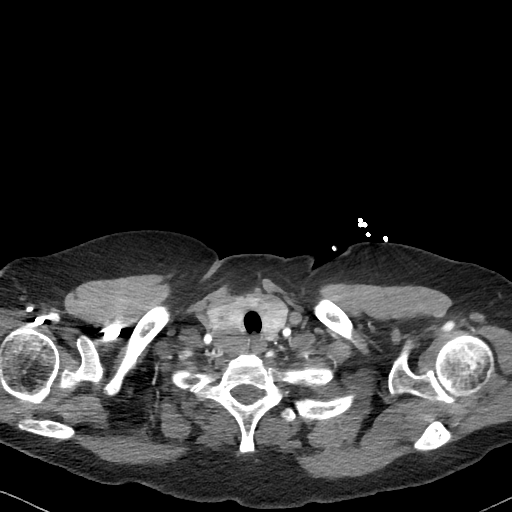

[Series 10: dissection 2mm cor · coronal · 0.50mm/px · 3 of 106 slices shown]
[im 27/106  soft-tissue]
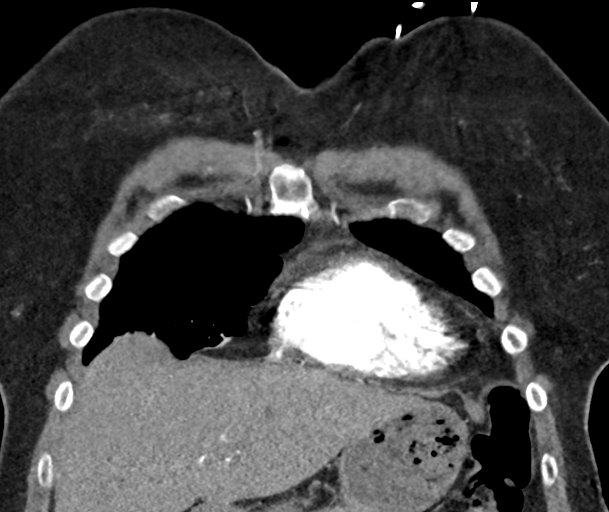
[im 53/106  soft-tissue]
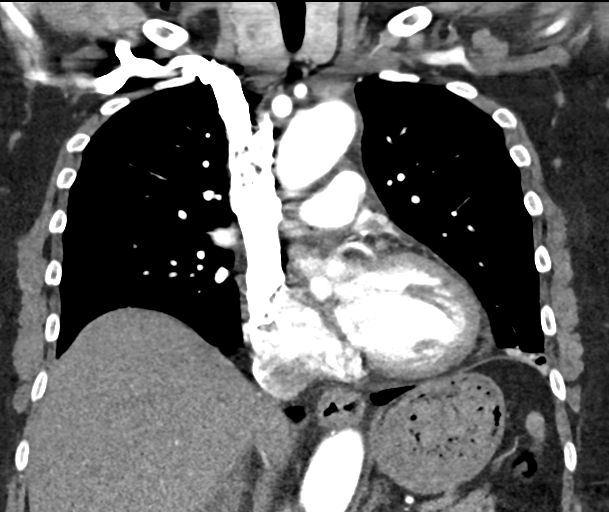
[im 79/106  soft-tissue]
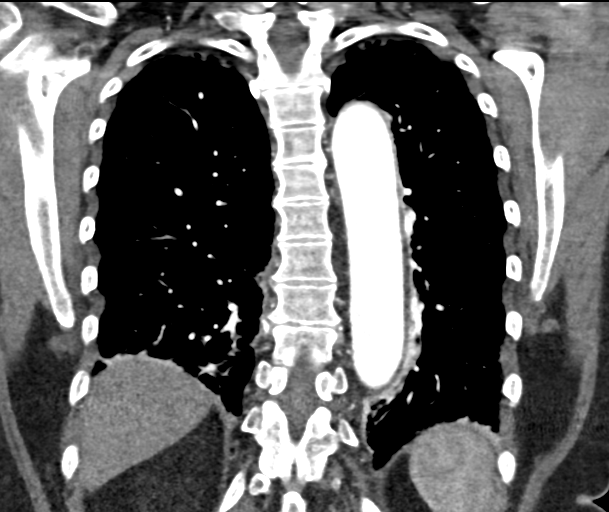

[17 of 46 positions shown; findings below may reference images not displayed]

FINDINGS: There is no evidence of aortic dissection. There is no evidence of
aneurysmal dilatation. No calcific atherosclerotic disease is seen.
The great vessels are grossly unremarkable in appearance.

There is no evidence of significant pulmonary embolus.

Mild bibasilar atelectasis is noted. The lungs are otherwise clear.
There is no evidence of significant focal consolidation, pleural
effusion or pneumothorax. No masses are identified; no abnormal
focal contrast enhancement is seen.

The mediastinum is unremarkable in appearance. No mediastinal
lymphadenopathy is seen. No pericardial effusion is identified. No
axillary lymphadenopathy is seen. A 2.2 cm hypodense lesion is noted
arising at the right thyroid lobe.

There is a 1.9 cm peripherally calcified soft tissue density nodule
at the right breast, about the 7 to 8 o'clock position, of uncertain
significance.

A nonspecific 1.1 cm hypodensity is noted within the right hepatic
lobe. The visualized portions of the spleen are unremarkable. The
visualized portions of the pancreas, adrenal glands and kidneys are
within normal limits. A tiny hiatal hernia is noted.

No acute osseous abnormalities are seen.

Review of the MIP images confirms the above findings.
IMPRESSION: 1. No evidence of aortic dissection. No evidence of aneurysmal
dilatation. No calcific atherosclerotic disease seen.
2. No evidence of significant pulmonary embolus.
3. Mild bibasilar atelectasis noted.  Lungs otherwise clear.
4. **An incidental finding of potential clinical significance has
been found. 2.2 cm hypodense lesion arising at the right thyroid
lobe. Consider further evaluation with thyroid ultrasound. If
patient is clinically hyperthyroid, consider nuclear medicine
thyroid uptake and scan.**
5. **An incidental finding of potential clinical significance has
been found. 1.1 cm nonspecific hypodensity at the right hepatic
lobe. Would correlate with LFTs, as deemed clinically appropriate.**
6. 1.9 cm peripherally calcified soft tissue density nodule at the
right breast, about the 7 to 8 o'clock position, of uncertain
significance. Would correlate with prior mammogram, and consider
diagnostic mammogram for further evaluation.

## 2018-03-04 ENCOUNTER — Other Ambulatory Visit: Payer: Self-pay

## 2018-11-15 ENCOUNTER — Other Ambulatory Visit: Payer: Self-pay | Admitting: Family Medicine

## 2018-11-15 DIAGNOSIS — Z1231 Encounter for screening mammogram for malignant neoplasm of breast: Secondary | ICD-10-CM

## 2019-12-19 ENCOUNTER — Ambulatory Visit (INDEPENDENT_AMBULATORY_CARE_PROVIDER_SITE_OTHER): Payer: Medicare Other | Admitting: Cardiovascular Disease

## 2019-12-19 ENCOUNTER — Encounter: Payer: Self-pay | Admitting: Cardiovascular Disease

## 2019-12-19 ENCOUNTER — Other Ambulatory Visit: Payer: Self-pay

## 2019-12-19 VITALS — BP 142/80 | HR 71 | Ht 63.0 in | Wt 171.4 lb

## 2019-12-19 DIAGNOSIS — R0600 Dyspnea, unspecified: Secondary | ICD-10-CM

## 2019-12-19 DIAGNOSIS — R0609 Other forms of dyspnea: Secondary | ICD-10-CM

## 2019-12-19 DIAGNOSIS — R002 Palpitations: Secondary | ICD-10-CM | POA: Diagnosis not present

## 2019-12-19 NOTE — Progress Notes (Signed)
CARDIOLOGY CONSULT NOTE       Patient ID: Tracy Bruce MRN: [id number] DOB/AGE: 1945/11/20 74 y.o.  Admit date: (Not on file) Referring Physician: Jacelyn Grip Primary Physician: Vernie Shanks, MD Primary Cardiologist: New Reason for Consultation: Fatigue  Active Problems:   * No active hospital problems. *   HPI:  74 y.o. referred by Dr Jacelyn Grip for fatigue Last seen by Dr Chalmers Cater and Rentz 2017 with atypical pain CT with no PE and no coronary calcium Echo normal 11/21/15  History of HTN. 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 She cares for her brother She has had some dyspnea last few months. No chest pain, palpitations syncope She has gained some weight She tries to exercise/steps daily. Denies chronic lung disease Very distant smoking Wheezy  Exam but she denies asthma. She has had neuropathic pain in feet. Primary drew blood last week she denies anemia and has nodular thyroid but says levels or ok   ROS All other systems reviewed and negative except as noted above  Past Medical History:  Diagnosis Date  . Acute midline low back pain without sciatica   . Arthritis   . Closed nondisplaced fracture of phalanx of lesser toe of right foot   . Contusion of right foot   . Dermatitis   . Dysphagia, unspecified(787.20) 07/29/12  . Folliculitis   . GERD (gastroesophageal reflux disease)   . Gout of right foot   . H/O colonoscopy   . HPV in female   . Hypercholesterolemia   . Hypertension   . Hypokalemia   . Legally blind   . Liver nodule   . Migraine   . Migraine   . Multiple thyroid nodules   . Numbness   . Osteopenia   . Osteoporosis   . Pain in toe   . Personal history of colonic polyps   . Polyarthritis   . Retinitis pigmentosa   . Retinitis pigmentosa   . Rheumatoid factor positive   . Swelling of toe of right foot   . Thyroid nodule   . Trigger index finger of left hand   . Viral upper respiratory tract infection   . Vitamin  D deficiency     Family History  Problem Relation Age of Onset  . Heart disease Mother   . Lung cancer Father   . Lung cancer Brother   . Liver disease Brother   . Alcohol abuse Brother   . Alcohol abuse Brother   . Alcohol abuse Brother   . Alcohol abuse Brother   . Schizophrenia Brother   . Drug abuse Brother   . Colon cancer Maternal Aunt   . Colon cancer Maternal Uncle   . Colon polyps Cousin   . Alcohol abuse Brother     Social History   Socioeconomic History  . Marital status: Divorced    Spouse name: Not on file  . Number of children: 3  . Years of education: Not on file  . Highest education level: Not on file  Occupational History  . Occupation: Disabled  Tobacco Use  . Smoking status: Former Smoker    Packs/day: 0.33    Quit date: 03/21/2003    Years since quitting: 16.7  . Smokeless tobacco: Never Used  Substance and Sexual Activity  . Alcohol use: Yes    Comment: glass of wine monthly   . Drug use: No  . Sexual activity: Not on file  Other Topics Concern  .  Not on file  Social History Narrative   Pt brother lives with her. She takes care of him. Her cousin drives for her.    Social Determinants of Health   Financial Resource Strain:   . Difficulty of Paying Living Expenses: Not on file  Food Insecurity:   . Worried About Charity fundraiser in the Last Year: Not on file  . Ran Out of Food in the Last Year: Not on file  Transportation Needs:   . Lack of Transportation (Medical): Not on file  . Lack of Transportation (Non-Medical): Not on file  Physical Activity:   . Days of Exercise per Week: Not on file  . Minutes of Exercise per Session: Not on file  Stress:   . Feeling of Stress : Not on file  Social Connections:   . Frequency of Communication with Friends and Family: Not on file  . Frequency of Social Gatherings with Friends and Family: Not on file  . Attends Religious Services: Not on file  . Active Member of Clubs or Organizations: Not on  file  . Attends Archivist Meetings: Not on file  . Marital Status: Not on file  Intimate Partner Violence:   . Fear of Current or Ex-Partner: Not on file  . Emotionally Abused: Not on file  . Physically Abused: Not on file  . Sexually Abused: Not on file    Past Surgical History:  Procedure Laterality Date  . ABDOMINAL HYSTERECTOMY    . CATARACT EXTRACTION    . LASER LAPAROSCOPY        Current Outpatient Medications:  .  cloNIDine (CATAPRES) 0.1 MG tablet, Take 0.1 mg by mouth 2 (two) times daily., Disp: , Rfl:  .  hydrochlorothiazide (HYDRODIURIL) 25 MG tablet, Take 25 mg by mouth daily., Disp: , Rfl:  .  ibuprofen (ADVIL,MOTRIN) 200 MG tablet, Take 200 mg by mouth every 6 (six) hours as needed., Disp: , Rfl:  .  meloxicam (MOBIC) 15 MG tablet, Take 15 mg by mouth daily., Disp: , Rfl:  .  NON FORMULARY, OMEGA RED DAILY, Disp: , Rfl:  .  vitamin A 10000 UNIT capsule, Take 10,000 Units by mouth daily., Disp: , Rfl:  .  vitamin B-12 (CYANOCOBALAMIN) 1000 MCG tablet, Take 1,000 mcg by mouth daily., Disp: , Rfl:     Physical Exam: Blood pressure (!) 142/80, pulse 71, height 5\' 3"  (1.6 m), weight 171 lb 6.4 oz (77.7 kg), SpO2 96 %.   Affect appropriate Healthy:  appears stated age 60: Legally blind  Neck supple with no adenopathy JVP normal no bruits no thyromegaly Lungs mild expiratory wheezing  Heart:  S1/S2 no murmur, no rub, gallop or click PMI normal Abdomen: benighn, BS positve, no tenderness, no AAA no bruit.  No HSM or HJR Distal pulses intact with no bruits No edema Neuro non-focal Skin warm and dry No muscular weakness   Labs:   Lab Results  Component Value Date   WBC 10.9 (H) 11/21/2015   HGB 13.1 11/21/2015   HCT 40.3 11/21/2015   MCV 90.6 11/21/2015   PLT 313 11/21/2015   No results for input(s): NA, K, CL, CO2, BUN, CREATININE, CALCIUM, PROT, BILITOT, ALKPHOS, ALT, AST, GLUCOSE in the last 168 hours.  Invalid input(s): LABALBU Lab  Results  Component Value Date   TROPONINI <0.03 11/20/2015    Lab Results  Component Value Date   CHOL 183 11/20/2015   Lab Results  Component Value Date   HDL 49  11/20/2015   Lab Results  Component Value Date   LDLCALC 125 (H) 11/20/2015   Lab Results  Component Value Date   TRIG 44 11/20/2015   Lab Results  Component Value Date   CHOLHDL 3.7 11/20/2015   No results found for: LDLDIRECT    Radiology: No results found.  EKG: SR rate 71 PVC otherwise normal    ASSESSMENT AND PLAN:   1. Dyspnea:  Non discript Exam normal will order echo to assess EF and CXR given ? Allergies and mild Wheezing on exam f/u primary consider inhaler  2. HTN:  Continue current meds stable 3. RP:  Very functional for being legally blind good family support   Signed: Jenkins Rouge 12/19/2019, 4:04 PM

## 2019-12-19 NOTE — Patient Instructions (Addendum)
Medication Instructions:  *If you need a refill on your cardiac medications before your next appointment, please call your pharmacy*  Lab Work: If you have labs (blood work) drawn today and your tests are completely normal, you will receive your results only by: Marland Kitchen MyChart Message (if you have MyChart) OR . A paper copy in the mail If you have any lab test that is abnormal or we need to change your treatment, we will call you to review the results.  Testing/Procedures: A chest x-ray takes a picture of the organs and structures inside the chest, including the heart, lungs, and blood vessels. This test can show several things, including, whether the heart is enlarges; whether fluid is building up in the lungs; and whether pacemaker / defibrillator leads are still in place. Please go to [facility], [place] [address]   Your physician has requested that you have an echocardiogram. Echocardiography is a painless test that uses sound waves to create images of your heart. It provides your doctor with information about the size and shape of your heart and how well your heart's chambers and valves are working. This procedure takes approximately one hour. There are no restrictions for this procedure.  Follow-Up: At [facility], you and your health needs are our priority.  As part of our continuing mission to provide you with exceptional heart care, we have created designated Provider Care Teams.  These Care Teams include your primary Cardiologist (physician) and Advanced Practice Providers (APPs -  Physician Assistants and Nurse Practitioners) who all work together to provide you with the care you need, when you need it.  We recommend signing up for the patient portal called "MyChart".  Sign up information is provided on this After Visit Summary.  MyChart is used to connect with patients for Virtual Visits (Telemedicine).  Patients are able to view lab/test results, encounter notes, upcoming  appointments, etc.  Non-urgent messages can be sent to your provider as well.   To learn more about what you can do with MyChart, go to [email or web address].    Your next appointment:   As needed  The format for your next appointment:   In Person  Provider:   You may see Dr. Johnsie Cancel or one of the following Advanced Practice Providers on your designated Care Team:    Truitt Merle, NP  Cecilie Kicks, NP  Kathyrn Drown, NP

## 2020-01-06 ENCOUNTER — Other Ambulatory Visit: Payer: Self-pay

## 2020-01-06 ENCOUNTER — Ambulatory Visit (HOSPITAL_COMMUNITY): Payer: Medicare Other | Attending: Cardiovascular Disease

## 2020-01-06 ENCOUNTER — Ambulatory Visit
Admission: RE | Admit: 2020-01-06 | Discharge: 2020-01-06 | Disposition: A | Discharge status: Home / Self Care | Payer: Medicare Other | Source: Ambulatory Visit | Attending: Cardiovascular Disease | Admitting: Cardiovascular Disease

## 2020-01-06 DIAGNOSIS — R002 Palpitations: Secondary | ICD-10-CM | POA: Insufficient documentation

## 2020-01-06 DIAGNOSIS — I10 Essential (primary) hypertension: Secondary | ICD-10-CM | POA: Insufficient documentation

## 2020-01-06 DIAGNOSIS — E785 Hyperlipidemia, unspecified: Secondary | ICD-10-CM | POA: Diagnosis not present

## 2020-01-06 DIAGNOSIS — R0602 Shortness of breath: Secondary | ICD-10-CM | POA: Diagnosis not present

## 2020-01-06 DIAGNOSIS — Z87891 Personal history of nicotine dependence: Secondary | ICD-10-CM | POA: Insufficient documentation

## 2020-01-06 DIAGNOSIS — R0600 Dyspnea, unspecified: Secondary | ICD-10-CM

## 2020-01-06 LAB — ECHOCARDIOGRAM COMPLETE
Area-P 1/2: 3.77 cm2
S' Lateral: 2.8 cm

## 2020-03-04 ENCOUNTER — Ambulatory Visit: Payer: Medicare Other | Admitting: Podiatry

## 2020-03-04 ENCOUNTER — Encounter: Payer: Self-pay | Admitting: Podiatry

## 2020-03-04 ENCOUNTER — Other Ambulatory Visit: Payer: Self-pay

## 2020-03-04 DIAGNOSIS — M779 Enthesopathy, unspecified: Secondary | ICD-10-CM | POA: Diagnosis not present

## 2020-03-04 DIAGNOSIS — L600 Ingrowing nail: Secondary | ICD-10-CM

## 2020-03-04 NOTE — Patient Instructions (Signed)

## 2020-03-05 NOTE — Progress Notes (Signed)
Subjective:   Patient ID: Tracy Bruce, female   DOB: 74 y.o.   MRN: [id number]   HPI Patient presents with caregiver with painful ingrown toenail deformity right big toe and patient is legally blind.  States that it is been sore and she also has structural deformity of both feet with moderate bunion deformity.  Patient does not smoke likes to be active if possible   Review of Systems  All other systems reviewed and are negative.       Objective:  Physical Exam Vitals and nursing note reviewed.  Constitutional:      Appearance: She is well-developed and well-nourished.  Cardiovascular:     Pulses: Intact distal pulses.  Pulmonary:     Effort: Pulmonary effort is normal.  Musculoskeletal:        General: Normal range of motion.  Skin:    General: Skin is warm.  Neurological:     Mental Status: She is alert.     Neurovascular was found to be intact muscle strength found to be adequate range of motion within normal limits.  Patient has an incurvated right hallux medial border painful when pressed and has structural deformity bilateral and can be uncomfortable with shoes at times.  Patient has good digital perfusion well oriented x3     Assessment:  Ingrown toenail deformity right hallux medial border along with structural HAV deformity bilateral     Plan:  H&P reviewed conditions and explained.  Today I went ahead and I discussed correction of his ingrown toenail and I do not recommend bunion correction and she wants procedure and with caregiver understands condition risk and consent form is signed.  I infiltrated the right hallux 60 mg Xylocaine Marcaine mixture sterile prep done and using sterile instrumentation remove the medial border exposed matrix applied phenol 3 applications 30 seconds followed by alcohol lavage sterile dressing and gave instructions on soaks and to leave dressing on 24 hours but take it off earlier if any throbbing were to occur.  Patient will be seen  back is encouraged to call with questions concerns and possible correction of deformity may be necessary in future

## 2020-04-27 DIAGNOSIS — R200 Anesthesia of skin: Secondary | ICD-10-CM | POA: Diagnosis not present

## 2020-04-27 DIAGNOSIS — Z79899 Other long term (current) drug therapy: Secondary | ICD-10-CM | POA: Diagnosis not present

## 2020-04-27 DIAGNOSIS — R946 Abnormal results of thyroid function studies: Secondary | ICD-10-CM | POA: Diagnosis not present

## 2020-04-27 DIAGNOSIS — I10 Essential (primary) hypertension: Secondary | ICD-10-CM | POA: Diagnosis not present

## 2020-04-27 DIAGNOSIS — E559 Vitamin D deficiency, unspecified: Secondary | ICD-10-CM | POA: Diagnosis not present

## 2020-04-27 DIAGNOSIS — R5383 Other fatigue: Secondary | ICD-10-CM | POA: Diagnosis not present

## 2020-04-27 DIAGNOSIS — M10071 Idiopathic gout, right ankle and foot: Secondary | ICD-10-CM | POA: Diagnosis not present

## 2020-04-27 DIAGNOSIS — H3552 Pigmentary retinal dystrophy: Secondary | ICD-10-CM | POA: Diagnosis not present

## 2020-04-27 DIAGNOSIS — R8290 Unspecified abnormal findings in urine: Secondary | ICD-10-CM | POA: Diagnosis not present

## 2020-04-27 DIAGNOSIS — M810 Age-related osteoporosis without current pathological fracture: Secondary | ICD-10-CM | POA: Diagnosis not present

## 2020-06-10 DIAGNOSIS — H04123 Dry eye syndrome of bilateral lacrimal glands: Secondary | ICD-10-CM | POA: Diagnosis not present

## 2020-06-11 DIAGNOSIS — E7800 Pure hypercholesterolemia, unspecified: Secondary | ICD-10-CM | POA: Diagnosis not present

## 2020-06-11 DIAGNOSIS — K219 Gastro-esophageal reflux disease without esophagitis: Secondary | ICD-10-CM | POA: Diagnosis not present

## 2020-06-11 DIAGNOSIS — M810 Age-related osteoporosis without current pathological fracture: Secondary | ICD-10-CM | POA: Diagnosis not present

## 2020-06-11 DIAGNOSIS — M8580 Other specified disorders of bone density and structure, unspecified site: Secondary | ICD-10-CM | POA: Diagnosis not present

## 2020-06-11 DIAGNOSIS — I10 Essential (primary) hypertension: Secondary | ICD-10-CM | POA: Diagnosis not present

## 2020-06-25 DIAGNOSIS — M19042 Primary osteoarthritis, left hand: Secondary | ICD-10-CM | POA: Diagnosis not present

## 2020-06-25 DIAGNOSIS — M109 Gout, unspecified: Secondary | ICD-10-CM | POA: Diagnosis not present

## 2020-06-25 DIAGNOSIS — M810 Age-related osteoporosis without current pathological fracture: Secondary | ICD-10-CM | POA: Diagnosis not present

## 2020-06-25 DIAGNOSIS — R350 Frequency of micturition: Secondary | ICD-10-CM | POA: Diagnosis not present

## 2020-06-25 DIAGNOSIS — I10 Essential (primary) hypertension: Secondary | ICD-10-CM | POA: Diagnosis not present

## 2020-06-25 DIAGNOSIS — E7800 Pure hypercholesterolemia, unspecified: Secondary | ICD-10-CM | POA: Diagnosis not present

## 2020-06-25 DIAGNOSIS — E559 Vitamin D deficiency, unspecified: Secondary | ICD-10-CM | POA: Diagnosis not present

## 2020-06-25 DIAGNOSIS — M1711 Unilateral primary osteoarthritis, right knee: Secondary | ICD-10-CM | POA: Diagnosis not present

## 2020-08-31 DIAGNOSIS — M19042 Primary osteoarthritis, left hand: Secondary | ICD-10-CM | POA: Diagnosis not present

## 2020-08-31 DIAGNOSIS — K219 Gastro-esophageal reflux disease without esophagitis: Secondary | ICD-10-CM | POA: Diagnosis not present

## 2020-08-31 DIAGNOSIS — M8580 Other specified disorders of bone density and structure, unspecified site: Secondary | ICD-10-CM | POA: Diagnosis not present

## 2020-08-31 DIAGNOSIS — M810 Age-related osteoporosis without current pathological fracture: Secondary | ICD-10-CM | POA: Diagnosis not present

## 2020-08-31 DIAGNOSIS — E7800 Pure hypercholesterolemia, unspecified: Secondary | ICD-10-CM | POA: Diagnosis not present

## 2020-08-31 DIAGNOSIS — M1711 Unilateral primary osteoarthritis, right knee: Secondary | ICD-10-CM | POA: Diagnosis not present

## 2020-08-31 DIAGNOSIS — I10 Essential (primary) hypertension: Secondary | ICD-10-CM | POA: Diagnosis not present

## 2020-09-16 DIAGNOSIS — M109 Gout, unspecified: Secondary | ICD-10-CM | POA: Diagnosis not present

## 2020-09-16 DIAGNOSIS — M810 Age-related osteoporosis without current pathological fracture: Secondary | ICD-10-CM | POA: Diagnosis not present

## 2020-09-16 DIAGNOSIS — E790 Hyperuricemia without signs of inflammatory arthritis and tophaceous disease: Secondary | ICD-10-CM | POA: Diagnosis not present

## 2020-09-16 DIAGNOSIS — E7800 Pure hypercholesterolemia, unspecified: Secondary | ICD-10-CM | POA: Diagnosis not present

## 2020-09-16 DIAGNOSIS — M19042 Primary osteoarthritis, left hand: Secondary | ICD-10-CM | POA: Diagnosis not present

## 2020-09-16 DIAGNOSIS — M1711 Unilateral primary osteoarthritis, right knee: Secondary | ICD-10-CM | POA: Diagnosis not present

## 2020-09-16 DIAGNOSIS — E559 Vitamin D deficiency, unspecified: Secondary | ICD-10-CM | POA: Diagnosis not present

## 2020-09-16 DIAGNOSIS — I739 Peripheral vascular disease, unspecified: Secondary | ICD-10-CM | POA: Diagnosis not present

## 2020-09-16 DIAGNOSIS — I10 Essential (primary) hypertension: Secondary | ICD-10-CM | POA: Diagnosis not present

## 2020-10-05 DIAGNOSIS — E559 Vitamin D deficiency, unspecified: Secondary | ICD-10-CM | POA: Diagnosis not present

## 2020-10-05 DIAGNOSIS — E7800 Pure hypercholesterolemia, unspecified: Secondary | ICD-10-CM | POA: Diagnosis not present

## 2020-10-05 DIAGNOSIS — M109 Gout, unspecified: Secondary | ICD-10-CM | POA: Diagnosis not present

## 2020-10-05 DIAGNOSIS — I739 Peripheral vascular disease, unspecified: Secondary | ICD-10-CM | POA: Diagnosis not present

## 2020-10-05 DIAGNOSIS — M810 Age-related osteoporosis without current pathological fracture: Secondary | ICD-10-CM | POA: Diagnosis not present

## 2020-10-05 DIAGNOSIS — I10 Essential (primary) hypertension: Secondary | ICD-10-CM | POA: Diagnosis not present

## 2020-11-18 DIAGNOSIS — E7800 Pure hypercholesterolemia, unspecified: Secondary | ICD-10-CM | POA: Diagnosis not present

## 2020-11-18 DIAGNOSIS — I10 Essential (primary) hypertension: Secondary | ICD-10-CM | POA: Diagnosis not present

## 2020-11-18 DIAGNOSIS — M65331 Trigger finger, right middle finger: Secondary | ICD-10-CM | POA: Diagnosis not present

## 2020-11-18 DIAGNOSIS — E559 Vitamin D deficiency, unspecified: Secondary | ICD-10-CM | POA: Diagnosis not present

## 2020-11-18 DIAGNOSIS — I739 Peripheral vascular disease, unspecified: Secondary | ICD-10-CM | POA: Diagnosis not present

## 2020-11-18 DIAGNOSIS — M109 Gout, unspecified: Secondary | ICD-10-CM | POA: Diagnosis not present

## 2020-11-18 DIAGNOSIS — M810 Age-related osteoporosis without current pathological fracture: Secondary | ICD-10-CM | POA: Diagnosis not present

## 2021-01-06 DIAGNOSIS — E7800 Pure hypercholesterolemia, unspecified: Secondary | ICD-10-CM | POA: Diagnosis not present

## 2021-01-06 DIAGNOSIS — M1711 Unilateral primary osteoarthritis, right knee: Secondary | ICD-10-CM | POA: Diagnosis not present

## 2021-01-06 DIAGNOSIS — M810 Age-related osteoporosis without current pathological fracture: Secondary | ICD-10-CM | POA: Diagnosis not present

## 2021-01-06 DIAGNOSIS — M8580 Other specified disorders of bone density and structure, unspecified site: Secondary | ICD-10-CM | POA: Diagnosis not present

## 2021-01-06 DIAGNOSIS — M19042 Primary osteoarthritis, left hand: Secondary | ICD-10-CM | POA: Diagnosis not present

## 2021-01-06 DIAGNOSIS — I10 Essential (primary) hypertension: Secondary | ICD-10-CM | POA: Diagnosis not present

## 2021-01-06 DIAGNOSIS — K219 Gastro-esophageal reflux disease without esophagitis: Secondary | ICD-10-CM | POA: Diagnosis not present

## 2021-01-27 DIAGNOSIS — Z1231 Encounter for screening mammogram for malignant neoplasm of breast: Secondary | ICD-10-CM | POA: Diagnosis not present

## 2021-02-17 DIAGNOSIS — H3552 Pigmentary retinal dystrophy: Secondary | ICD-10-CM | POA: Diagnosis not present

## 2021-02-17 DIAGNOSIS — H04123 Dry eye syndrome of bilateral lacrimal glands: Secondary | ICD-10-CM | POA: Diagnosis not present

## 2021-02-17 DIAGNOSIS — H35373 Puckering of macula, bilateral: Secondary | ICD-10-CM | POA: Diagnosis not present

## 2021-02-25 DIAGNOSIS — Z0000 Encounter for general adult medical examination without abnormal findings: Secondary | ICD-10-CM | POA: Diagnosis not present

## 2021-06-16 DIAGNOSIS — K219 Gastro-esophageal reflux disease without esophagitis: Secondary | ICD-10-CM | POA: Diagnosis not present

## 2021-06-16 DIAGNOSIS — M19042 Primary osteoarthritis, left hand: Secondary | ICD-10-CM | POA: Diagnosis not present

## 2021-06-16 DIAGNOSIS — M810 Age-related osteoporosis without current pathological fracture: Secondary | ICD-10-CM | POA: Diagnosis not present

## 2021-06-16 DIAGNOSIS — Z8739 Personal history of other diseases of the musculoskeletal system and connective tissue: Secondary | ICD-10-CM | POA: Diagnosis not present

## 2021-06-16 DIAGNOSIS — R946 Abnormal results of thyroid function studies: Secondary | ICD-10-CM | POA: Diagnosis not present

## 2021-06-16 DIAGNOSIS — I10 Essential (primary) hypertension: Secondary | ICD-10-CM | POA: Diagnosis not present

## 2021-06-16 DIAGNOSIS — M1711 Unilateral primary osteoarthritis, right knee: Secondary | ICD-10-CM | POA: Diagnosis not present

## 2021-06-16 DIAGNOSIS — M8580 Other specified disorders of bone density and structure, unspecified site: Secondary | ICD-10-CM | POA: Diagnosis not present

## 2021-06-16 DIAGNOSIS — R202 Paresthesia of skin: Secondary | ICD-10-CM | POA: Diagnosis not present

## 2021-06-16 DIAGNOSIS — E7800 Pure hypercholesterolemia, unspecified: Secondary | ICD-10-CM | POA: Diagnosis not present

## 2021-07-15 DIAGNOSIS — M810 Age-related osteoporosis without current pathological fracture: Secondary | ICD-10-CM | POA: Diagnosis not present

## 2021-07-15 DIAGNOSIS — R946 Abnormal results of thyroid function studies: Secondary | ICD-10-CM | POA: Diagnosis not present

## 2021-07-15 DIAGNOSIS — E7800 Pure hypercholesterolemia, unspecified: Secondary | ICD-10-CM | POA: Diagnosis not present

## 2021-07-15 DIAGNOSIS — Z8739 Personal history of other diseases of the musculoskeletal system and connective tissue: Secondary | ICD-10-CM | POA: Diagnosis not present

## 2021-07-15 DIAGNOSIS — M1711 Unilateral primary osteoarthritis, right knee: Secondary | ICD-10-CM | POA: Diagnosis not present

## 2021-07-15 DIAGNOSIS — I10 Essential (primary) hypertension: Secondary | ICD-10-CM | POA: Diagnosis not present

## 2021-07-15 DIAGNOSIS — K219 Gastro-esophageal reflux disease without esophagitis: Secondary | ICD-10-CM | POA: Diagnosis not present

## 2021-07-15 DIAGNOSIS — M19042 Primary osteoarthritis, left hand: Secondary | ICD-10-CM | POA: Diagnosis not present

## 2021-07-15 DIAGNOSIS — R202 Paresthesia of skin: Secondary | ICD-10-CM | POA: Diagnosis not present

## 2021-07-21 DIAGNOSIS — H04123 Dry eye syndrome of bilateral lacrimal glands: Secondary | ICD-10-CM | POA: Diagnosis not present

## 2021-10-10 DIAGNOSIS — H3552 Pigmentary retinal dystrophy: Secondary | ICD-10-CM | POA: Diagnosis not present

## 2021-10-10 DIAGNOSIS — Z8739 Personal history of other diseases of the musculoskeletal system and connective tissue: Secondary | ICD-10-CM | POA: Diagnosis not present

## 2021-10-10 DIAGNOSIS — I499 Cardiac arrhythmia, unspecified: Secondary | ICD-10-CM | POA: Diagnosis not present

## 2021-10-10 DIAGNOSIS — R1032 Left lower quadrant pain: Secondary | ICD-10-CM | POA: Diagnosis not present

## 2021-10-10 DIAGNOSIS — R202 Paresthesia of skin: Secondary | ICD-10-CM | POA: Diagnosis not present

## 2021-10-10 DIAGNOSIS — I10 Essential (primary) hypertension: Secondary | ICD-10-CM | POA: Diagnosis not present

## 2022-01-11 DIAGNOSIS — E7800 Pure hypercholesterolemia, unspecified: Secondary | ICD-10-CM | POA: Diagnosis not present

## 2022-01-11 DIAGNOSIS — I10 Essential (primary) hypertension: Secondary | ICD-10-CM | POA: Diagnosis not present

## 2022-01-11 DIAGNOSIS — M1990 Unspecified osteoarthritis, unspecified site: Secondary | ICD-10-CM | POA: Diagnosis not present

## 2022-01-11 DIAGNOSIS — E559 Vitamin D deficiency, unspecified: Secondary | ICD-10-CM | POA: Diagnosis not present

## 2022-02-03 DIAGNOSIS — Z1231 Encounter for screening mammogram for malignant neoplasm of breast: Secondary | ICD-10-CM | POA: Diagnosis not present

## 2022-03-02 DIAGNOSIS — H3552 Pigmentary retinal dystrophy: Secondary | ICD-10-CM | POA: Diagnosis not present

## 2022-03-02 DIAGNOSIS — H35373 Puckering of macula, bilateral: Secondary | ICD-10-CM | POA: Diagnosis not present

## 2022-04-14 DIAGNOSIS — E7800 Pure hypercholesterolemia, unspecified: Secondary | ICD-10-CM | POA: Diagnosis not present

## 2022-04-14 DIAGNOSIS — I10 Essential (primary) hypertension: Secondary | ICD-10-CM | POA: Diagnosis not present

## 2022-04-14 DIAGNOSIS — Z0000 Encounter for general adult medical examination without abnormal findings: Secondary | ICD-10-CM | POA: Diagnosis not present

## 2022-06-15 DIAGNOSIS — R8290 Unspecified abnormal findings in urine: Secondary | ICD-10-CM | POA: Diagnosis not present

## 2022-06-15 DIAGNOSIS — M25511 Pain in right shoulder: Secondary | ICD-10-CM | POA: Diagnosis not present

## 2022-06-15 DIAGNOSIS — R300 Dysuria: Secondary | ICD-10-CM | POA: Diagnosis not present

## 2022-06-28 DIAGNOSIS — M25511 Pain in right shoulder: Secondary | ICD-10-CM | POA: Diagnosis not present

## 2022-06-28 DIAGNOSIS — M25512 Pain in left shoulder: Secondary | ICD-10-CM | POA: Diagnosis not present

## 2022-07-12 DIAGNOSIS — H540X55 Blindness right eye category 5, blindness left eye category 5: Secondary | ICD-10-CM | POA: Diagnosis not present

## 2022-07-12 DIAGNOSIS — H04123 Dry eye syndrome of bilateral lacrimal glands: Secondary | ICD-10-CM | POA: Diagnosis not present

## 2022-07-12 DIAGNOSIS — H3552 Pigmentary retinal dystrophy: Secondary | ICD-10-CM | POA: Diagnosis not present

## 2022-09-06 DIAGNOSIS — H04123 Dry eye syndrome of bilateral lacrimal glands: Secondary | ICD-10-CM | POA: Diagnosis not present

## 2022-10-06 DIAGNOSIS — E559 Vitamin D deficiency, unspecified: Secondary | ICD-10-CM | POA: Diagnosis not present

## 2022-10-06 DIAGNOSIS — I10 Essential (primary) hypertension: Secondary | ICD-10-CM | POA: Diagnosis not present

## 2022-10-06 DIAGNOSIS — E7800 Pure hypercholesterolemia, unspecified: Secondary | ICD-10-CM | POA: Diagnosis not present

## 2022-10-06 DIAGNOSIS — M810 Age-related osteoporosis without current pathological fracture: Secondary | ICD-10-CM | POA: Diagnosis not present

## 2023-02-05 DIAGNOSIS — Z1231 Encounter for screening mammogram for malignant neoplasm of breast: Secondary | ICD-10-CM | POA: Diagnosis not present

## 2023-04-22 ENCOUNTER — Emergency Department (HOSPITAL_COMMUNITY): Payer: Medicare Other

## 2023-04-22 ENCOUNTER — Emergency Department (HOSPITAL_COMMUNITY)
Admission: EM | Admit: 2023-04-22 | Discharge: 2023-04-22 | Disposition: A | Discharge status: Home / Self Care | Payer: Medicare Other | Attending: Emergency Medicine | Admitting: Emergency Medicine

## 2023-04-22 DIAGNOSIS — I6523 Occlusion and stenosis of bilateral carotid arteries: Secondary | ICD-10-CM | POA: Diagnosis not present

## 2023-04-22 DIAGNOSIS — J439 Emphysema, unspecified: Secondary | ICD-10-CM | POA: Diagnosis not present

## 2023-04-22 DIAGNOSIS — R519 Headache, unspecified: Secondary | ICD-10-CM | POA: Diagnosis present

## 2023-04-22 DIAGNOSIS — R42 Dizziness and giddiness: Secondary | ICD-10-CM | POA: Diagnosis not present

## 2023-04-22 DIAGNOSIS — S066X0A Traumatic subarachnoid hemorrhage without loss of consciousness, initial encounter: Secondary | ICD-10-CM | POA: Insufficient documentation

## 2023-04-22 DIAGNOSIS — I609 Nontraumatic subarachnoid hemorrhage, unspecified: Secondary | ICD-10-CM | POA: Diagnosis not present

## 2023-04-22 DIAGNOSIS — S199XXA Unspecified injury of neck, initial encounter: Secondary | ICD-10-CM | POA: Diagnosis not present

## 2023-04-22 DIAGNOSIS — M5030 Other cervical disc degeneration, unspecified cervical region: Secondary | ICD-10-CM | POA: Diagnosis not present

## 2023-04-22 DIAGNOSIS — S0990XA Unspecified injury of head, initial encounter: Secondary | ICD-10-CM | POA: Diagnosis not present

## 2023-04-22 DIAGNOSIS — I10 Essential (primary) hypertension: Secondary | ICD-10-CM | POA: Diagnosis not present

## 2023-04-22 DIAGNOSIS — Y92002 Bathroom of unspecified non-institutional (private) residence single-family (private) house as the place of occurrence of the external cause: Secondary | ICD-10-CM | POA: Diagnosis not present

## 2023-04-22 DIAGNOSIS — W19XXXA Unspecified fall, initial encounter: Secondary | ICD-10-CM

## 2023-04-22 DIAGNOSIS — W1830XA Fall on same level, unspecified, initial encounter: Secondary | ICD-10-CM | POA: Diagnosis not present

## 2023-04-22 LAB — CBC WITH DIFFERENTIAL/PLATELET
Abs Immature Granulocytes: 0 10*3/uL (ref 0.00–0.07)
Basophils Absolute: 0 10*3/uL (ref 0.0–0.1)
Basophils Relative: 0 %
Eosinophils Absolute: 0 10*3/uL (ref 0.0–0.5)
Eosinophils Relative: 0 %
HCT: 44.1 % (ref 36.0–46.0)
Hemoglobin: 14.8 g/dL (ref 12.0–15.0)
Immature Granulocytes: 0 %
Lymphocytes Relative: 9 %
Lymphs Abs: 0.5 10*3/uL — ABNORMAL LOW (ref 0.7–4.0)
MCH: 30.3 pg (ref 26.0–34.0)
MCHC: 33.6 g/dL (ref 30.0–36.0)
MCV: 90.4 fL (ref 80.0–100.0)
Monocytes Absolute: 0.3 10*3/uL (ref 0.1–1.0)
Monocytes Relative: 5 %
Neutro Abs: 4.6 10*3/uL (ref 1.7–7.7)
Neutrophils Relative %: 86 %
Platelets: 226 10*3/uL (ref 150–400)
RBC: 4.88 MIL/uL (ref 3.87–5.11)
RDW: 13.2 % (ref 11.5–15.5)
WBC: 5.4 10*3/uL (ref 4.0–10.5)
nRBC: 0 % (ref 0.0–0.2)

## 2023-04-22 LAB — BASIC METABOLIC PANEL
Anion gap: 13 (ref 5–15)
BUN: 11 mg/dL (ref 8–23)
CO2: 22 mmol/L (ref 22–32)
Calcium: 8.7 mg/dL — ABNORMAL LOW (ref 8.9–10.3)
Chloride: 103 mmol/L (ref 98–111)
Creatinine, Ser: 0.99 mg/dL (ref 0.44–1.00)
GFR, Estimated: 59 mL/min — ABNORMAL LOW (ref >60)
Glucose, Bld: 96 mg/dL (ref 70–99)
Potassium: 3.1 mmol/L — ABNORMAL LOW (ref 3.5–5.1)
Sodium: 138 mmol/L (ref 135–145)

## 2023-04-22 MED ORDER — ONDANSETRON 4 MG PO TBDP
8.0000 mg | ORAL_TABLET | Freq: Once | ORAL | Status: AC
  Administered 2023-04-22: 8 mg via ORAL
  Filled 2023-04-22: qty 2

## 2023-04-22 MED ORDER — SODIUM CHLORIDE 0.9 % IV BOLUS
1000.0000 mL | Freq: Once | INTRAVENOUS | Status: AC
  Administered 2023-04-22: 1000 mL via INTRAVENOUS

## 2023-04-22 NOTE — ED Provider Triage Note (Signed)
Emergency Medicine Provider Triage Evaluation Note  Britnay Magnussen , a 78 y.o. female  was evaluated in triage.  Pt complains of head pain, upper neck pain.  Patient arrives via EMS after fall that occurred overnight.  She notes that she fell, struck the back of her head.  She has no new focal weakness, no vision changes, though at baseline she has negligible visual capacity.  Review of Systems  Positive: Fall, head pain Negative: Focal neurochanges  Physical Exam  BP (!) 175/111   Pulse 79   Temp 99.4 F (37.4 C)   Resp 14   SpO2 98%  Gen:   Awake, no distress elderly female speaking clearly Resp:  Normal effort no increased work of breathing MSK:   Moves extremities without difficulty no neck abnormality noted  Medical Decision Making  Medically screening exam initiated at 1:31 PM.  Appropriate orders placed.  Aradhya Shellenbarger was informed that the remainder of the evaluation will be completed by another provider, this initial triage assessment does not replace that evaluation, and the importance of remaining in the ED until their evaluation is complete.   Gerhard Munch, MD 04/22/23 1331

## 2023-04-22 NOTE — Progress Notes (Signed)
Patient ID: Tracy Bruce, female   DOB: April 28, 1945, 78 y.o.   MRN: [id number] BP (!) 140/93   Pulse 73   Temp 98.1 F (36.7 C) (Temporal)   Resp 14   SpO2 100%  Tracy Bruce was seem and examined.  Alert and oriented x 4 Speech is clear and fluent Clinically blind, can perceive light.  Symmetric facies, tongue and uvula midline Normal shoulder shrug 5/5 strength in all extremities Light touch intact. CT Cervical Spine Wo Contrast Result Date: 04/22/2023 CLINICAL DATA:  Polytrauma, blunt EXAM: CT CERVICAL SPINE WITHOUT CONTRAST TECHNIQUE: Multidetector CT imaging of the cervical spine was performed without intravenous contrast. Multiplanar CT image reconstructions were also generated. RADIATION DOSE REDUCTION: This exam was performed according to the departmental dose-optimization program which includes automated exposure control, adjustment of the mA and/or kV according to patient size and/or use of iterative reconstruction technique. COMPARISON:  None Available. FINDINGS: Alignment: Facet joints are aligned without dislocation or traumatic listhesis. Dens and lateral masses are aligned. Skull base and vertebrae: No acute fracture. No primary bone lesion or focal pathologic process. Soft tissues and spinal canal: No prevertebral fluid or swelling. No visible canal hematoma. 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 Upper chest: Biapical pleuroparenchymal scarring.  Mild emphysema. Other: Bilateral carotid atherosclerosis. IMPRESSION: 1. No acute fracture or traumatic listhesis of the cervical spine. 2. 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 3. Emphysema (ICD10-J43.9). Electronically Signed   By: Duanne Guess D.O.   On: 04/22/2023 16:33   CT Head Wo Contrast Result Date: 04/22/2023 CLINICAL DATA:  Polytrauma, blunt EXAM: CT HEAD WITHOUT CONTRAST TECHNIQUE: Contiguous axial images were obtained from the base of the skull through the  vertex without intravenous contrast. RADIATION DOSE REDUCTION: This exam was performed according to the departmental dose-optimization program which includes automated exposure control, adjustment of the mA and/or kV according to patient size and/or use of iterative reconstruction technique. COMPARISON:  None Available. FINDINGS: Brain: Trace amount of subarachnoid hemorrhage in the superior left frontal region (series 3, image 21). No additional sites of intracranial hemorrhage. No mass effect or midline shift. No hydrocephalus. No evidence of an acute infarction. Scattered low-density changes within the periventricular and subcortical white matter most compatible with chronic microvascular ischemic change. Mild diffuse cerebral volume loss. Vascular: Atherosclerotic calcifications involving the large vessels of the skull base. No unexpected hyperdense vessel. Skull: Normal. Negative for fracture or focal lesion. Sinuses/Orbits: No acute finding. Other: Negative for scalp hematoma. IMPRESSION: Trace amount of subarachnoid hemorrhage in the superior left frontal region. These results were called by telephone at the time of interpretation on 04/22/2023 at 4:24 pm to provider Gerhard Munch , who verbally acknowledged these results. Electronically Signed   By: Duanne Guess D.O.   On: 04/22/2023 16:24   The traumatic subarachnoid is minuscule. There is no likely future where this would pose a problem. OK for discharge to her family.  Has normal exam and is at baseline.  Has been sick this week, and syncopal episode also immediately after standing. She had a bowel movement and urinated. Could have had a vagal response, she is also likely dehydrated secondary to recent illness.  No need for followup, no need for repeat CT scan of the head.

## 2023-04-22 NOTE — ED Provider Notes (Signed)
[place] EMERGENCY DEPARTMENT AT [facility] Provider Note   CSN: [id number] Arrival date & time: 04/22/23  1259     History  Chief Complaint  Patient presents with   Fall   Dizziness    Tracy Bruce is a 78 y.o. female.  HPI The patient and evaluated her in triage.  Patient presents after a series of falls.  Patient has baseline blindness, has no change in that, no other new focal weakness, but does present with pain in the back of her head, upper neck following her most recent fall which occurred overnight.  Home Medications Prior to Admission medications   Medication Sig Start Date End Date Taking? Authorizing Provider  cloNIDine (CATAPRES) 0.1 MG tablet Take 0.1 mg by mouth 2 (two) times daily.    [provider]  hydrochlorothiazide (HYDRODIURIL) 25 MG tablet Take 25 mg by mouth daily.    [provider]  ibuprofen (ADVIL,MOTRIN) 200 MG tablet Take 200 mg by mouth every 6 (six) hours as needed.    [provider]  meloxicam (MOBIC) 15 MG tablet Take 15 mg by mouth daily.    [provider]  NON FORMULARY OMEGA RED DAILY    [provider]  vitamin A 40981 UNIT capsule Take 10,000 Units by mouth daily.    [provider]  vitamin B-12 (CYANOCOBALAMIN) 1000 MCG tablet Take 1,000 mcg by mouth daily.    [provider]      Allergies    Patient has no known allergies.    Review of Systems   Review of Systems  Physical Exam Updated Vital Signs BP (!) 175/111   Pulse 79   Temp 99.4 F (37.4 C)   Resp 14   SpO2 98%  Physical Exam Vitals and nursing note reviewed.  Constitutional:      General: She is not in acute distress.    Appearance: She is well-developed.  HENT:     Head: Normocephalic and atraumatic.  Eyes:     Conjunctiva/sclera: Conjunctivae normal.  Cardiovascular:     Rate and Rhythm: Normal rate and regular rhythm.  Pulmonary:     Effort: Pulmonary effort is normal. No  respiratory distress.     Breath sounds: Normal breath sounds. No stridor.  Abdominal:     General: There is no distension.  Skin:    General: Skin is warm and dry.  Neurological:     Mental Status: She is alert and oriented to person, place, and time.     Cranial Nerves: Cranial nerve deficit present.     Comments: Patient appreciates visual motion vaguely only  Psychiatric:        Mood and Affect: Mood normal.     ED Results / Procedures / Treatments   Labs (all labs ordered are listed, but only abnormal results are displayed) Labs Reviewed  BASIC METABOLIC PANEL - Abnormal; Notable for the following components:      Result Value   Potassium 3.1 (*)    Calcium 8.7 (*)    GFR, Estimated 59 (*)    All other components within normal limits  CBC WITH DIFFERENTIAL/PLATELET - Abnormal; Notable for the following components:   Lymphs Abs 0.5 (*)    All other components within normal limits    EKG None  Radiology No results found.  Procedures Procedures    Medications Ordered in ED Medications  ondansetron (ZOFRAN-ODT) disintegrating tablet 8 mg (has no administration in time range)    ED  Course/ Medical Decision Making/ A&P                                 Medical Decision Making Adult female presents after fall with subsequent dizziness, lightheadedness, nausea.  Patient has blindness at baseline, this is unchanged and she has no focal neurodeficits, but given her age, head trauma, concern for intracranial abnormality versus fracture considered, head CT ordered, labs ordered.   Amount and/or Complexity of Data Reviewed Independent Historian: EMS    Details: And daughter at bedside eventually Labs: ordered. Decision-making details documented in ED Course. Radiology: ordered and independent interpretation performed. Decision-making details documented in ED Course.  Risk Prescription drug management. Decision regarding hospitalization. Diagnosis or treatment  significantly limited by social determinants of health.  Update: I discussed the patient's radiology abnormality with the radiologist, trace subarachnoid.  Update patient's family at bedside.  Patient's vital signs unremarkable, cardiac 75 sinus normal Pulse ox 100% room air normal   7:06 PM Have discussed the patient's case with our neurosurgeon, Dr. Franky Macho.  He has seen that and evaluated the patient as well.  Patient is generally well, and now almost 12 hours after the initial event has no evidence for decompensation, is awake, alert, without neurochanges, is hemodynamically stable.  Labs reviewed, discussed, unremarkable.  Some suspicion for dehydration/vagal episode contributing to her near syncope's, fall, with consideration of concussion contributing to her post fall dizziness.  Patient designated as appropriate for discharge with close outpatient follow-up.  Patient, family, comfortable with this plan.  He has been monitored for hours in the ED without decompensation, he is not on any anticoagulation, and will follow-up closely with Dr. Franky Macho in the office in regards to her subarachnoid hemorrhage.  CRITICAL CARE Performed by: Gerhard Munch Total critical care time: 35 minutes Critical care time was exclusive of separately billable procedures and treating other patients. Critical care was necessary to treat or prevent imminent or life-threatening deterioration. Critical care was time spent personally by me on the following activities: development of treatment plan with patient and/or surrogate as well as nursing, discussions with consultants, evaluation of patient's response to treatment, examination of patient, obtaining history from patient or surrogate, ordering and performing treatments and interventions, ordering and review of laboratory studies, ordering and review of radiographic studies, pulse oximetry and re-evaluation of patient's condition.  Final Clinical Impression(s) / ED  Diagnoses Final diagnoses:  SAH (subarachnoid hemorrhage) (HCC)  Fall, initial encounter    Rx / DC Orders ED Discharge Orders     None         Gerhard Munch, MD 04/22/23 [facility] Moment

## 2023-04-22 NOTE — ED Triage Notes (Signed)
Pt BIB EMS from home last night tripped and fell in bathroom hit back of head. No LOC. No thinners. Pt has felt dizzy since the fall and fell again this morning from the dizziness and was caught by daughter. Pain in neck and head with episodes of intermittent dizziness. Blind at baseline.   BP 170/106 78 18rr 96% RA  CBG 118

## 2023-04-22 NOTE — Discharge Instructions (Addendum)
Today's evaluation has been notable for diagnosis of a subarachnoid hemorrhage.  He likely also sustained a minor concussion.  Please monitor your condition carefully and be sure to follow-up with our neurosurgery colleague.  Return here for concerning changes in your condition.

## 2023-05-17 DIAGNOSIS — H04123 Dry eye syndrome of bilateral lacrimal glands: Secondary | ICD-10-CM | POA: Diagnosis not present

## 2023-05-17 DIAGNOSIS — H35373 Puckering of macula, bilateral: Secondary | ICD-10-CM | POA: Diagnosis not present

## 2023-05-17 DIAGNOSIS — H3552 Pigmentary retinal dystrophy: Secondary | ICD-10-CM | POA: Diagnosis not present

## 2023-05-25 DIAGNOSIS — Z0000 Encounter for general adult medical examination without abnormal findings: Secondary | ICD-10-CM | POA: Diagnosis not present

## 2023-05-25 DIAGNOSIS — E7800 Pure hypercholesterolemia, unspecified: Secondary | ICD-10-CM | POA: Diagnosis not present

## 2023-05-25 DIAGNOSIS — E559 Vitamin D deficiency, unspecified: Secondary | ICD-10-CM | POA: Diagnosis not present

## 2023-05-25 DIAGNOSIS — I10 Essential (primary) hypertension: Secondary | ICD-10-CM | POA: Diagnosis not present

## 2023-05-25 DIAGNOSIS — I739 Peripheral vascular disease, unspecified: Secondary | ICD-10-CM | POA: Diagnosis not present

## 2023-05-25 DIAGNOSIS — E041 Nontoxic single thyroid nodule: Secondary | ICD-10-CM | POA: Diagnosis not present

## 2023-06-01 DIAGNOSIS — E559 Vitamin D deficiency, unspecified: Secondary | ICD-10-CM | POA: Diagnosis not present

## 2023-06-01 DIAGNOSIS — E7800 Pure hypercholesterolemia, unspecified: Secondary | ICD-10-CM | POA: Diagnosis not present

## 2023-06-08 ENCOUNTER — Other Ambulatory Visit (HOSPITAL_COMMUNITY): Payer: Self-pay | Admitting: Family Medicine

## 2023-06-08 DIAGNOSIS — R519 Headache, unspecified: Secondary | ICD-10-CM

## 2023-06-08 DIAGNOSIS — Z8679 Personal history of other diseases of the circulatory system: Secondary | ICD-10-CM | POA: Diagnosis not present

## 2023-06-12 ENCOUNTER — Ambulatory Visit (HOSPITAL_COMMUNITY)
Admission: RE | Admit: 2023-06-12 | Discharge: 2023-06-12 | Disposition: A | Discharge status: Home / Self Care | Source: Ambulatory Visit | Attending: Family Medicine | Admitting: Family Medicine

## 2023-06-12 DIAGNOSIS — I6782 Cerebral ischemia: Secondary | ICD-10-CM | POA: Diagnosis not present

## 2023-06-12 DIAGNOSIS — R519 Headache, unspecified: Secondary | ICD-10-CM | POA: Diagnosis not present

## 2023-06-12 DIAGNOSIS — I609 Nontraumatic subarachnoid hemorrhage, unspecified: Secondary | ICD-10-CM | POA: Diagnosis not present

## 2023-06-15 DIAGNOSIS — Z9189 Other specified personal risk factors, not elsewhere classified: Secondary | ICD-10-CM | POA: Diagnosis not present

## 2023-06-15 DIAGNOSIS — Z8619 Personal history of other infectious and parasitic diseases: Secondary | ICD-10-CM | POA: Diagnosis not present

## 2023-06-22 DIAGNOSIS — Z860101 Personal history of adenomatous and serrated colon polyps: Secondary | ICD-10-CM | POA: Diagnosis not present

## 2023-06-29 DIAGNOSIS — E042 Nontoxic multinodular goiter: Secondary | ICD-10-CM | POA: Diagnosis not present

## 2023-06-29 DIAGNOSIS — E041 Nontoxic single thyroid nodule: Secondary | ICD-10-CM | POA: Diagnosis not present

## 2023-09-17 DIAGNOSIS — I10 Essential (primary) hypertension: Secondary | ICD-10-CM | POA: Diagnosis not present

## 2023-09-17 DIAGNOSIS — E7800 Pure hypercholesterolemia, unspecified: Secondary | ICD-10-CM | POA: Diagnosis not present

## 2023-09-17 DIAGNOSIS — M810 Age-related osteoporosis without current pathological fracture: Secondary | ICD-10-CM | POA: Diagnosis not present

## 2023-10-05 DIAGNOSIS — K6389 Other specified diseases of intestine: Secondary | ICD-10-CM | POA: Diagnosis not present

## 2023-10-05 DIAGNOSIS — Z860101 Personal history of adenomatous and serrated colon polyps: Secondary | ICD-10-CM | POA: Diagnosis not present

## 2023-10-05 DIAGNOSIS — Z09 Encounter for follow-up examination after completed treatment for conditions other than malignant neoplasm: Secondary | ICD-10-CM | POA: Diagnosis not present

## 2023-10-18 DIAGNOSIS — I10 Essential (primary) hypertension: Secondary | ICD-10-CM | POA: Diagnosis not present

## 2023-10-18 DIAGNOSIS — M810 Age-related osteoporosis without current pathological fracture: Secondary | ICD-10-CM | POA: Diagnosis not present

## 2023-10-18 DIAGNOSIS — E7800 Pure hypercholesterolemia, unspecified: Secondary | ICD-10-CM | POA: Diagnosis not present

## 2023-11-18 DIAGNOSIS — M810 Age-related osteoporosis without current pathological fracture: Secondary | ICD-10-CM | POA: Diagnosis not present

## 2023-11-18 DIAGNOSIS — I10 Essential (primary) hypertension: Secondary | ICD-10-CM | POA: Diagnosis not present

## 2023-11-18 DIAGNOSIS — E7800 Pure hypercholesterolemia, unspecified: Secondary | ICD-10-CM | POA: Diagnosis not present

## 2023-12-07 DIAGNOSIS — I499 Cardiac arrhythmia, unspecified: Secondary | ICD-10-CM | POA: Diagnosis not present

## 2023-12-07 DIAGNOSIS — I10 Essential (primary) hypertension: Secondary | ICD-10-CM | POA: Diagnosis not present

## 2023-12-18 DIAGNOSIS — I10 Essential (primary) hypertension: Secondary | ICD-10-CM | POA: Diagnosis not present

## 2023-12-18 DIAGNOSIS — M810 Age-related osteoporosis without current pathological fracture: Secondary | ICD-10-CM | POA: Diagnosis not present

## 2023-12-18 DIAGNOSIS — E7800 Pure hypercholesterolemia, unspecified: Secondary | ICD-10-CM | POA: Diagnosis not present

## 2023-12-21 DIAGNOSIS — I4719 Other supraventricular tachycardia: Secondary | ICD-10-CM | POA: Diagnosis not present

## 2023-12-21 DIAGNOSIS — I498 Other specified cardiac arrhythmias: Secondary | ICD-10-CM | POA: Diagnosis not present
